# Patient Record
Sex: Female | Born: 1975 | Race: White | Hispanic: No | Marital: Single | Smoking: Current every day smoker
Health system: Southern US, Community
[De-identification: ages and names within clinical notes are randomized; demographics above are authoritative.]

## PROBLEM LIST (undated history)

## (undated) DIAGNOSIS — F429 Obsessive-compulsive disorder, unspecified: Secondary | ICD-10-CM

## (undated) DIAGNOSIS — F4310 Post-traumatic stress disorder, unspecified: Secondary | ICD-10-CM

## (undated) DIAGNOSIS — K219 Gastro-esophageal reflux disease without esophagitis: Secondary | ICD-10-CM

## (undated) DIAGNOSIS — G5600 Carpal tunnel syndrome, unspecified upper limb: Secondary | ICD-10-CM

## (undated) DIAGNOSIS — F319 Bipolar disorder, unspecified: Secondary | ICD-10-CM

## (undated) DIAGNOSIS — M6530 Trigger finger, unspecified finger: Secondary | ICD-10-CM

## (undated) DIAGNOSIS — F419 Anxiety disorder, unspecified: Secondary | ICD-10-CM

## (undated) DIAGNOSIS — C801 Malignant (primary) neoplasm, unspecified: Secondary | ICD-10-CM

## (undated) DIAGNOSIS — IMO0001 Reserved for inherently not codable concepts without codable children: Secondary | ICD-10-CM

## (undated) DIAGNOSIS — J45909 Unspecified asthma, uncomplicated: Secondary | ICD-10-CM

## (undated) DIAGNOSIS — M1990 Unspecified osteoarthritis, unspecified site: Secondary | ICD-10-CM

## (undated) HISTORY — PX: CARDIAC CATHETERIZATION: SHX172

## (undated) HISTORY — PX: OTHER SURGICAL HISTORY: SHX169

## (undated) HISTORY — PX: WISDOM TOOTH EXTRACTION: SHX21

## (undated) HISTORY — PX: MOUTH SURGERY: SHX715

## (undated) HISTORY — PX: TUBAL LIGATION: SHX77

## (undated) SURGERY — Surgical Case
Anesthesia: *Unknown

---

## 1998-01-31 ENCOUNTER — Ambulatory Visit (HOSPITAL_COMMUNITY): Admission: RE | Admit: 1998-01-31 | Discharge: 1998-01-31 | Payer: Self-pay | Admitting: Obstetrics & Gynecology

## 1998-03-17 ENCOUNTER — Inpatient Hospital Stay (HOSPITAL_COMMUNITY): Admission: AD | Admit: 1998-03-17 | Discharge: 1998-03-17 | Payer: Self-pay

## 1998-03-28 ENCOUNTER — Observation Stay (HOSPITAL_COMMUNITY): Admission: AD | Admit: 1998-03-28 | Discharge: 1998-03-29 | Payer: Self-pay | Admitting: *Deleted

## 1998-03-31 ENCOUNTER — Encounter: Admission: RE | Admit: 1998-03-31 | Discharge: 1998-06-29 | Payer: Self-pay | Admitting: Obstetrics

## 1998-05-04 ENCOUNTER — Inpatient Hospital Stay (HOSPITAL_COMMUNITY): Admission: AD | Admit: 1998-05-04 | Discharge: 1998-05-04 | Payer: Self-pay | Admitting: Obstetrics

## 1998-05-17 ENCOUNTER — Inpatient Hospital Stay (HOSPITAL_COMMUNITY): Admission: AD | Admit: 1998-05-17 | Discharge: 1998-05-19 | Payer: Self-pay | Admitting: *Deleted

## 2005-01-07 ENCOUNTER — Emergency Department: Payer: Self-pay | Admitting: Emergency Medicine

## 2011-06-09 IMAGING — CR DG CLAVICLE*L*
1 series · 2 of 2 positions shown · non-contrast
Comparison: none

REASON FOR EXAM: pain   Flex 8
COMMENTS:   LMP: tubal lig

PROCEDURE:     DXR - DXR CLAVICLE LEFT  - [DATE]  [DATE]
RESULT:
No acute abnormality identified.

[Series 1: w clavicle ap left · 0.14mm/px · 2 of 2 slices shown]
[im 1/2]
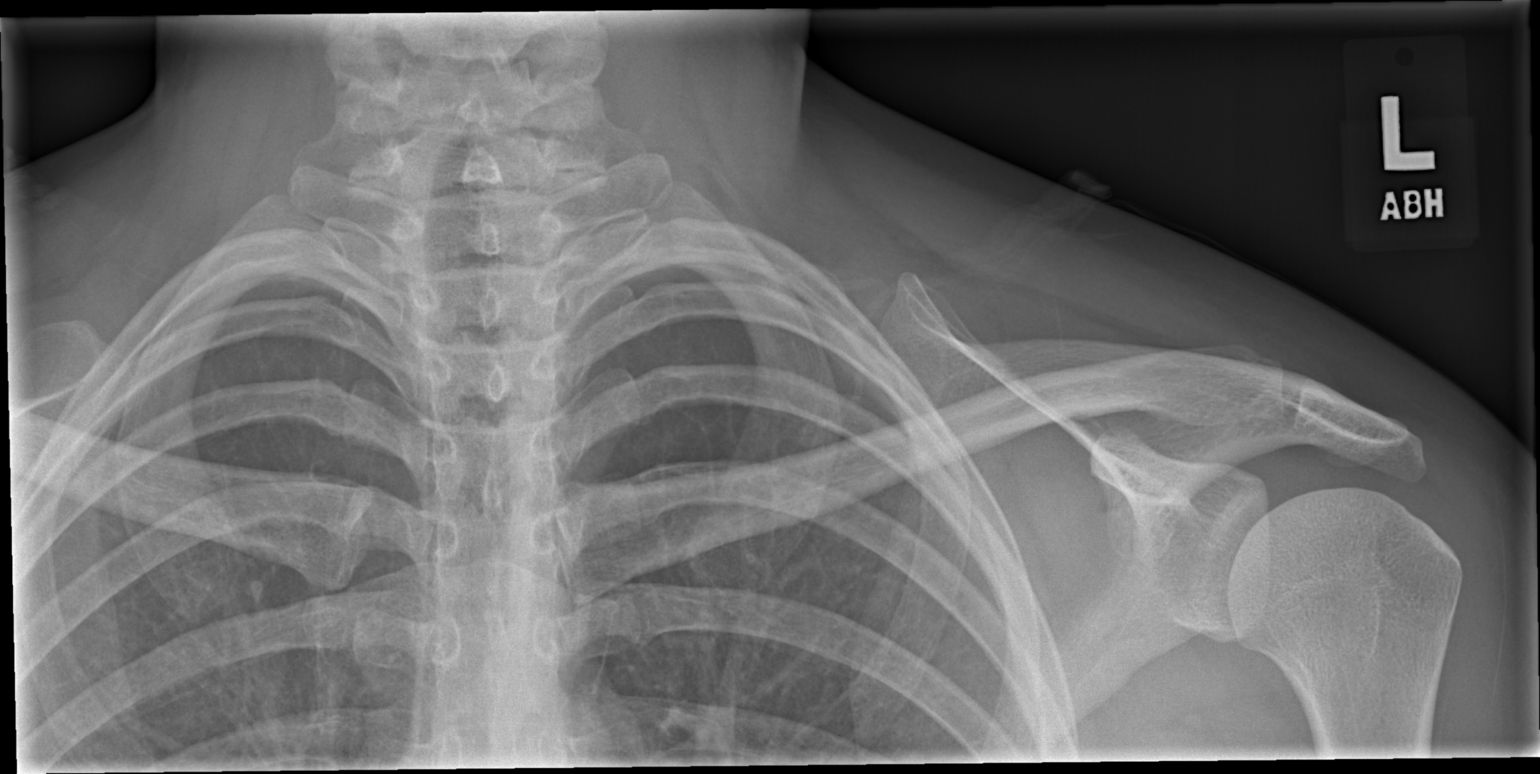
[im 2/2]
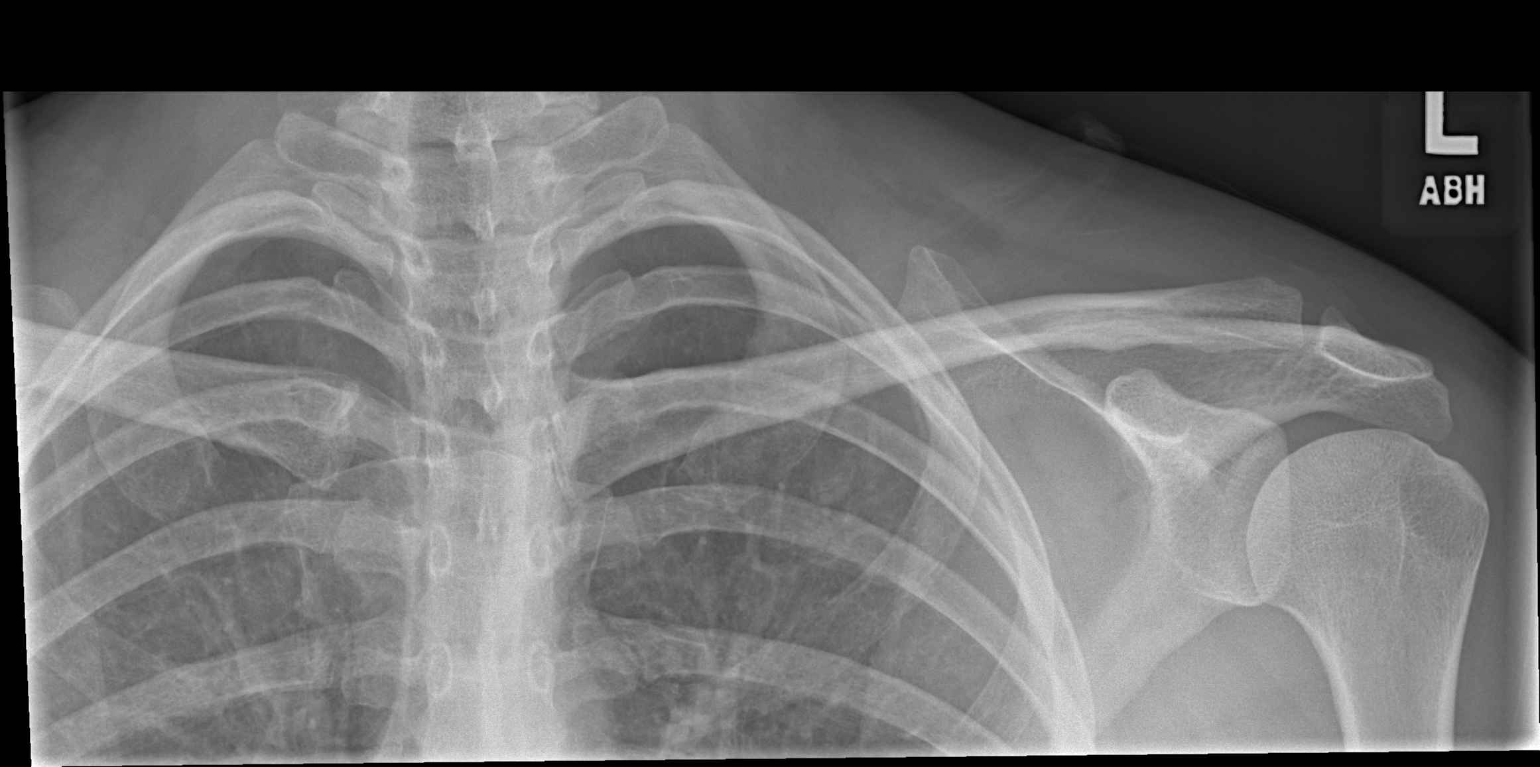

[2 of 2 positions shown; findings below may reference images not displayed]

IMPRESSION: No acute abnormality.

## 2012-01-19 ENCOUNTER — Emergency Department: Payer: Self-pay | Admitting: Emergency Medicine

## 2013-05-01 ENCOUNTER — Emergency Department (HOSPITAL_COMMUNITY)
Admission: EM | Admit: 2013-05-01 | Discharge: 2013-05-02 | Disposition: A | Discharge status: Home / Self Care | Payer: Self-pay | Attending: Emergency Medicine | Admitting: Emergency Medicine

## 2013-05-01 ENCOUNTER — Emergency Department (HOSPITAL_COMMUNITY): Payer: Self-pay

## 2013-05-01 ENCOUNTER — Encounter (HOSPITAL_COMMUNITY): Payer: Self-pay | Admitting: *Deleted

## 2013-05-01 DIAGNOSIS — R079 Chest pain, unspecified: Secondary | ICD-10-CM

## 2013-05-01 DIAGNOSIS — Z79899 Other long term (current) drug therapy: Secondary | ICD-10-CM | POA: Insufficient documentation

## 2013-05-01 DIAGNOSIS — F419 Anxiety disorder, unspecified: Secondary | ICD-10-CM

## 2013-05-01 DIAGNOSIS — R072 Precordial pain: Secondary | ICD-10-CM | POA: Insufficient documentation

## 2013-05-01 DIAGNOSIS — Z87891 Personal history of nicotine dependence: Secondary | ICD-10-CM | POA: Insufficient documentation

## 2013-05-01 DIAGNOSIS — F411 Generalized anxiety disorder: Secondary | ICD-10-CM | POA: Insufficient documentation

## 2013-05-01 LAB — CBC
HCT: 36.9 % (ref 36.0–46.0)
Hemoglobin: 12.8 g/dL (ref 12.0–15.0)
RBC: 4.26 MIL/uL (ref 3.87–5.11)
RDW: 13 % (ref 11.5–15.5)
WBC: 7.6 10*3/uL (ref 4.0–10.5)

## 2013-05-01 LAB — POCT PREGNANCY, URINE: Preg Test, Ur: NEGATIVE

## 2013-05-01 LAB — POCT I-STAT TROPONIN I

## 2013-05-01 LAB — BASIC METABOLIC PANEL
Chloride: 101 mEq/L (ref 96–112)
Creatinine, Ser: 0.68 mg/dL (ref 0.50–1.10)
GFR calc Af Amer: >90 mL/min (ref >90)
Potassium: 3.7 mEq/L (ref 3.5–5.1)
Sodium: 136 mEq/L (ref 135–145)

## 2013-05-01 IMAGING — CR DG CHEST 2V
2 series · 2 of 2 positions shown · non-contrast
Comparison: None.

CLINICAL DATA: Chest pain

CHEST - 2 VIEW

[w chest pa]
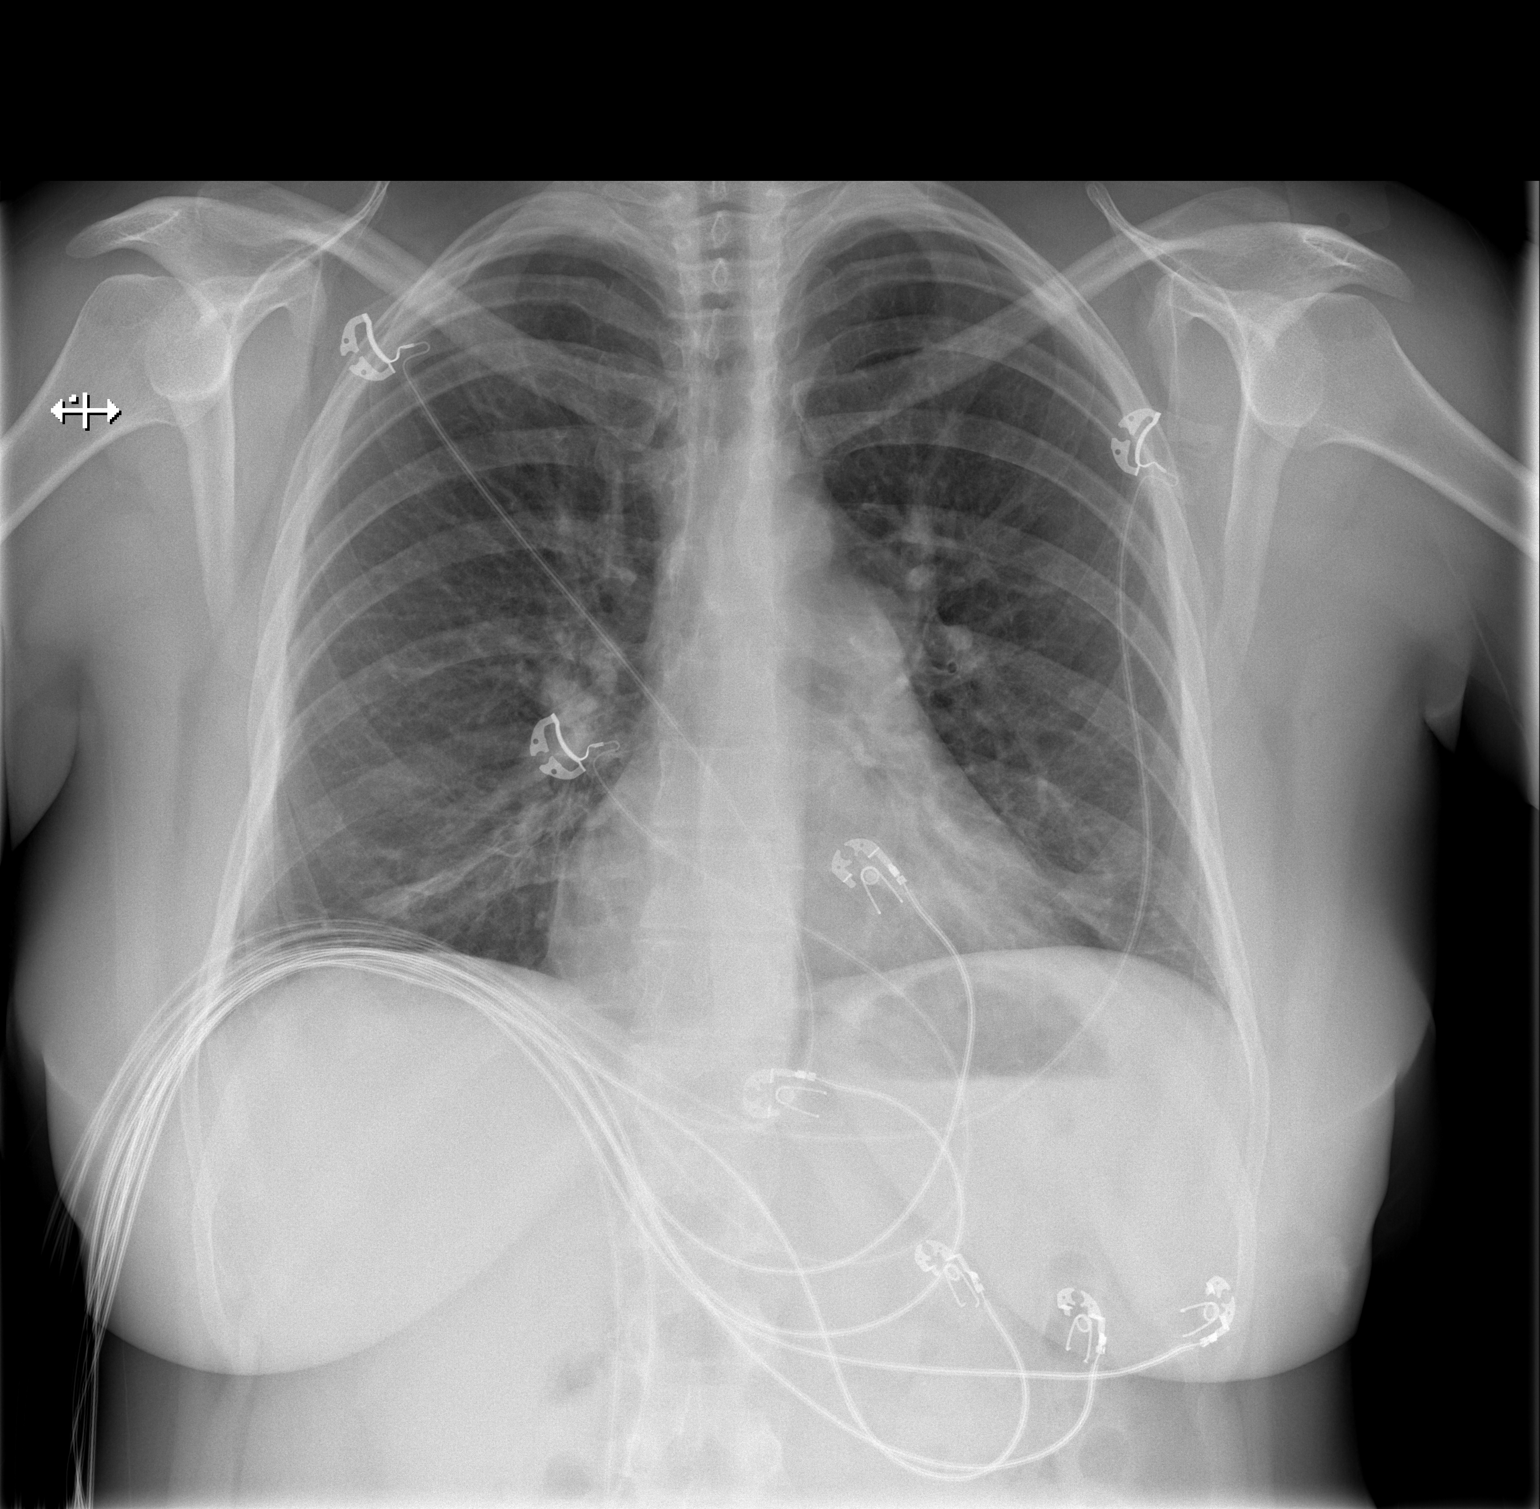

[w chest lat]
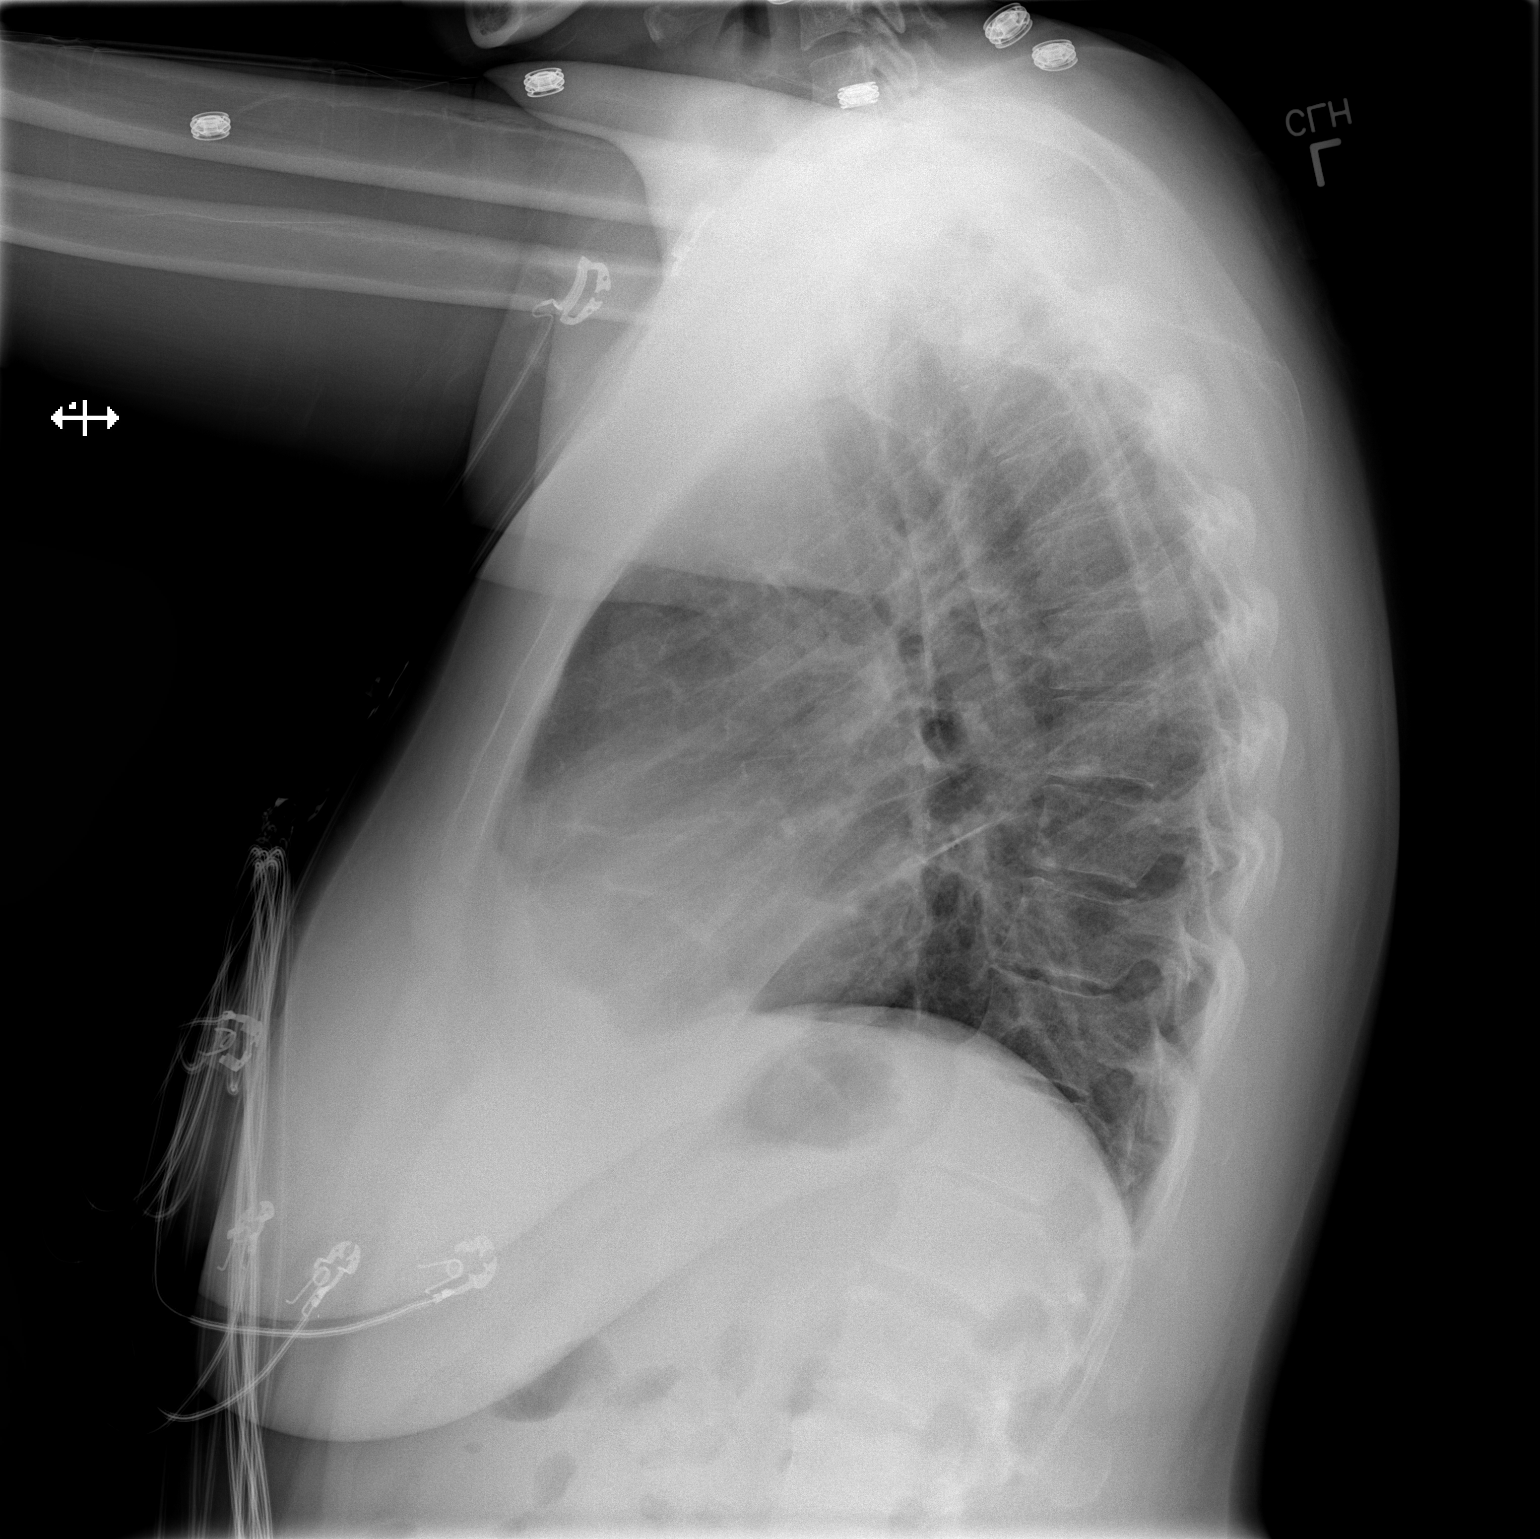

[2 of 2 positions shown; findings below may reference images not displayed]

FINDINGS: Mild lingular opacity.  Lungs are otherwise clear. No
pleural effusion or pneumothorax. The cardiomediastinal contours
are within normal limits. The visualized bones and soft tissues are
without significant appreciable abnormality.
IMPRESSION: Mild lingular opacity; atelectasis versus early infiltrate.

## 2013-05-01 MED ORDER — LORAZEPAM 2 MG/ML IJ SOLN
1.0000 mg | Freq: Once | INTRAMUSCULAR | Status: AC
  Administered 2013-05-01: 1 mg via INTRAVENOUS
  Filled 2013-05-01: qty 1

## 2013-05-01 NOTE — ED Notes (Addendum)
Chest pain - all over; left arm hurting. Onset: 2200; daily basis and intermittent. Much anxiety and stress in life. Nauseated.

## 2013-05-01 NOTE — ED Notes (Signed)
Pt states she was in an AA meeting and she began having central cp that radiates to left arm. Pt rates pain 10/10. Pt states she has been having the pain on and off for about a week, but it has never lasted this long. Pt states " It feels like someone is ripping my heart out". Pt states she has had a cardiac cath before but they were unable to find any blockage.

## 2013-05-02 MED ORDER — HYDROMORPHONE HCL PF 1 MG/ML IJ SOLN
0.5000 mg | Freq: Once | INTRAMUSCULAR | Status: AC
  Administered 2013-05-02: 0.5 mg via INTRAVENOUS
  Filled 2013-05-02: qty 1

## 2013-05-02 MED ORDER — LORAZEPAM 1 MG PO TABS: 1.0000 mg | ORAL_TABLET | Freq: Three times a day (TID) | ORAL | Status: DC | PRN

## 2013-05-02 MED ORDER — ONDANSETRON HCL 4 MG/2ML IJ SOLN
4.0000 mg | Freq: Once | INTRAMUSCULAR | Status: AC
  Administered 2013-05-02: 4 mg via INTRAVENOUS
  Filled 2013-05-02: qty 2

## 2013-05-02 MED ORDER — HYDROMORPHONE HCL PF 1 MG/ML IJ SOLN
1.0000 mg | Freq: Once | INTRAMUSCULAR | Status: AC
  Administered 2013-05-02: 1 mg via INTRAVENOUS
  Filled 2013-05-02: qty 1

## 2013-05-02 MED ORDER — LORAZEPAM 2 MG/ML IJ SOLN
1.0000 mg | Freq: Once | INTRAMUSCULAR | Status: AC
  Administered 2013-05-02: 1 mg via INTRAVENOUS
  Filled 2013-05-02: qty 1

## 2013-05-02 NOTE — ED Provider Notes (Signed)
History     CSN: [id number]  Arrival date & time 05/01/13  2250   First MD Initiated Contact with Patient 05/01/13 2305      Chief Complaint  Patient presents with  . Chest Pain    (Consider location/radiation/quality/duration/timing/severity/associated sxs/prior treatment) Patient is a 37 y.o. female presenting with chest pain. The history is provided by the patient (pt complains of chest pain). No language interpreter was used.  Chest Pain Pain location:  Substernal area Pain quality: aching   Pain radiates to:  Does not radiate Pain radiates to the back: no   Pain severity:  Moderate Timing:  Constant Chronicity:  New Associated symptoms: no abdominal pain, no back pain, no cough, no fatigue and no headache     History reviewed. No pertinent past medical history.  Past Surgical History  Procedure Laterality Date  . Cardiac catheterization      No family history on file.  History  Substance Use Topics  . Smoking status: Former Games developer  . Smokeless tobacco: Not on file  . Alcohol Use: No     Comment: 6 months clean.    OB History   Grav Para Term Preterm Abortions TAB SAB Ect Mult Living                  Review of Systems  Constitutional: Negative for appetite change and fatigue.  HENT: Negative for congestion, sinus pressure and ear discharge.   Eyes: Negative for discharge.  Respiratory: Negative for cough.   Cardiovascular: Positive for chest pain.  Gastrointestinal: Negative for abdominal pain and diarrhea.  Genitourinary: Negative for frequency and hematuria.  Musculoskeletal: Negative for back pain.  Skin: Negative for rash.  Neurological: Negative for seizures and headaches.  Psychiatric/Behavioral: Negative for hallucinations.    Allergies  Latex  Home Medications   Current Outpatient Rx  Name  Route  Sig  Dispense  Refill  . carbamazepine (TEGRETOL) 200 MG tablet   Oral   Take 200-400 mg by mouth 2 (two) times daily. 200 mg in a.m and  400 mg in pm         . FLUoxetine (PROZAC) 40 MG capsule   Oral   Take 40 mg by mouth every morning.          . traZODone (DESYREL) 100 MG tablet   Oral   Take 250 mg by mouth at bedtime.         Marland Kitchen LORazepam (ATIVAN) 1 MG tablet   Oral   Take 1 tablet (1 mg total) by mouth 3 (three) times daily as needed for anxiety.   20 tablet   0     BP 114/70  Pulse 83  Temp(Src) 98.4 F (36.9 C) (Oral)  Resp 17  SpO2 93%  LMP 04/01/2013  Physical Exam  Constitutional: She is oriented to person, place, and time. She appears well-developed.  HENT:  Head: Normocephalic.  Eyes: Conjunctivae and EOM are normal. No scleral icterus.  Neck: Neck supple. No thyromegaly present.  Cardiovascular: Normal rate and regular rhythm.  Exam reveals no gallop and no friction rub.   No murmur heard. Pulmonary/Chest: No stridor. She has no wheezes. She has no rales. She exhibits no tenderness.  Abdominal: She exhibits no distension. There is no tenderness. There is no rebound.  Musculoskeletal: Normal range of motion. She exhibits no edema.  Lymphadenopathy:    She has no cervical adenopathy.  Neurological: She is oriented to person, place, and time. Coordination normal.  Skin:  No rash noted. No erythema.  Psychiatric:  anxiety    ED Course  Procedures (including critical care time)  Labs Reviewed  BASIC METABOLIC PANEL  CBC  POCT I-STAT TROPONIN I  POCT PREGNANCY, URINE   Dg Chest 2 View  05/02/2013  *RADIOLOGY REPORT*  Clinical Data: Chest pain  CHEST - 2 VIEW  Comparison: None.  Findings: Mild lingular opacity.  Lungs are otherwise clear. No pleural effusion or pneumothorax. The cardiomediastinal contours are within normal limits. The visualized bones and soft tissues are without significant appreciable abnormality.  IMPRESSION: Mild lingular opacity; atelectasis versus early infiltrate.   Original Report Authenticated By: Jearld Lesch, M.D.      1. Chest pain   2. Anxiety       Date: 05/02/2013  Rate: 101  Rhythm: normal sinus rhythm  QRS Axis: normal  Intervals: normal  ST/T Wave abnormalities: normal  Conduction Disutrbances:none  Narrative Interpretation:   Old EKG Reviewed: unchanged    MDM          Benny Lennert, MD 05/02/13 [id number]

## 2013-05-02 NOTE — ED Notes (Signed)
Pt returned from CT. Reporting that pain is 8/10. Requesting more pain medication. MD made aware.

## 2013-05-02 NOTE — ED Notes (Signed)
Pt becoming incresingly agitated. Reports that pain rx has not been effective so far. Reports that pain has now moved from left chest to stomach. Reports 1 episode of vomiting. Denies nausea at this time but reports 10/10 cramping stomach pain. MD Zammit made aware.

## 2016-04-16 ENCOUNTER — Other Ambulatory Visit: Payer: Self-pay | Admitting: Physician Assistant

## 2016-04-16 DIAGNOSIS — R112 Nausea with vomiting, unspecified: Secondary | ICD-10-CM

## 2016-04-16 DIAGNOSIS — R1900 Intra-abdominal and pelvic swelling, mass and lump, unspecified site: Secondary | ICD-10-CM

## 2016-04-16 DIAGNOSIS — R1084 Generalized abdominal pain: Secondary | ICD-10-CM

## 2016-04-23 ENCOUNTER — Ambulatory Visit
Admission: RE | Admit: 2016-04-23 | Discharge: 2016-04-23 | Disposition: A | Discharge status: Home / Self Care | Payer: BLUE CROSS/BLUE SHIELD | Source: Ambulatory Visit | Attending: Physician Assistant | Admitting: Physician Assistant

## 2016-04-23 DIAGNOSIS — R1084 Generalized abdominal pain: Secondary | ICD-10-CM

## 2016-04-23 DIAGNOSIS — R1900 Intra-abdominal and pelvic swelling, mass and lump, unspecified site: Secondary | ICD-10-CM

## 2016-04-23 DIAGNOSIS — R112 Nausea with vomiting, unspecified: Secondary | ICD-10-CM

## 2016-04-23 IMAGING — US US ABDOMEN COMPLETE
1 series · 14 of 25 positions shown · non-contrast
Comparison: None.

CLINICAL DATA: 39-year-old female with abdominal pain, nausea,
vomiting and abdominal swelling. Initial encounter.

EXAM:
ABDOMEN ULTRASOUND COMPLETE

[Series 1: us abdomen complete · 0.24mm/px · 14 of 88 slices shown]
[im 1/88]
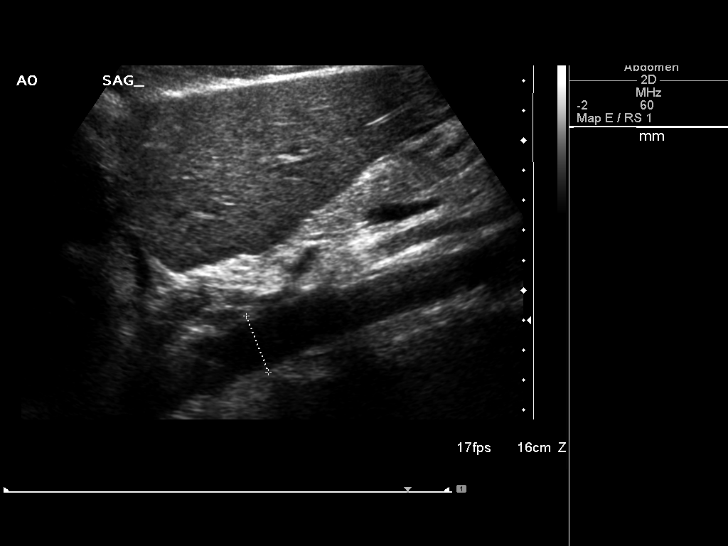
[im 8/88]
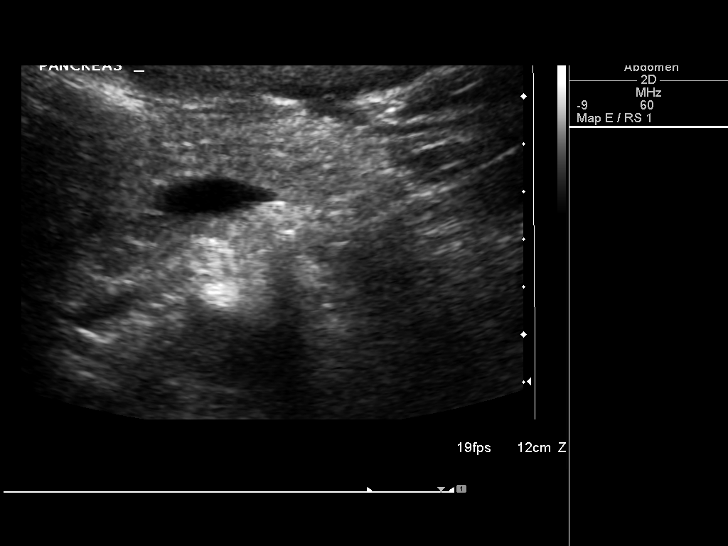
[im 15/88]
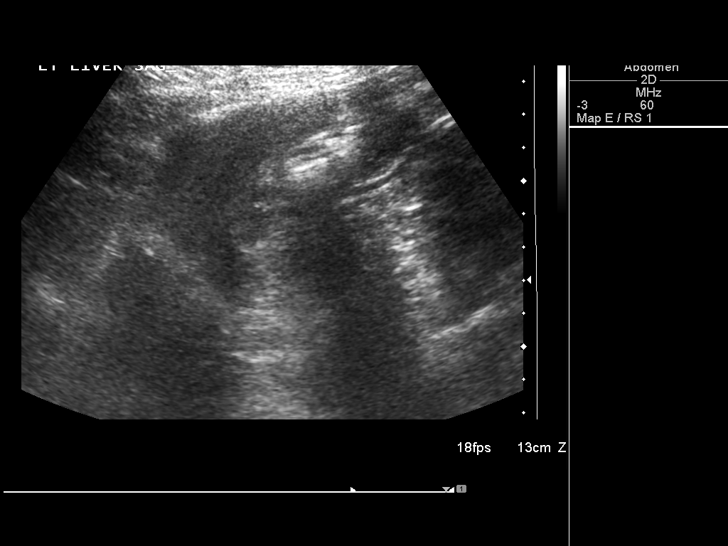
[im 22/88]
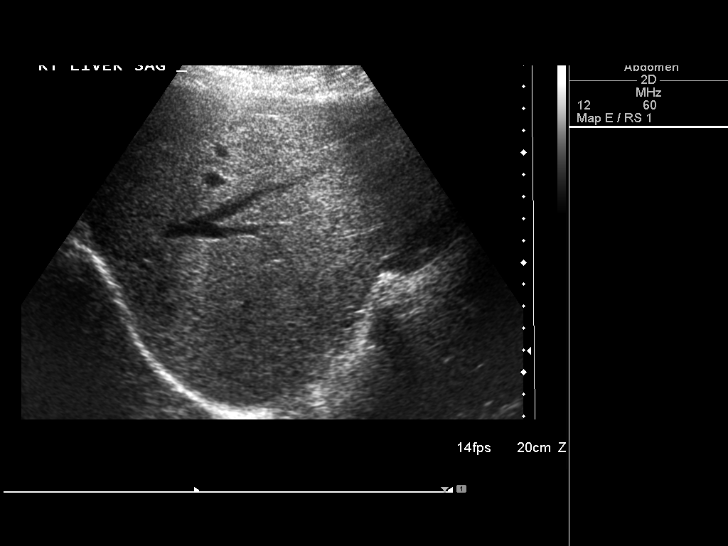
[im 30/88]
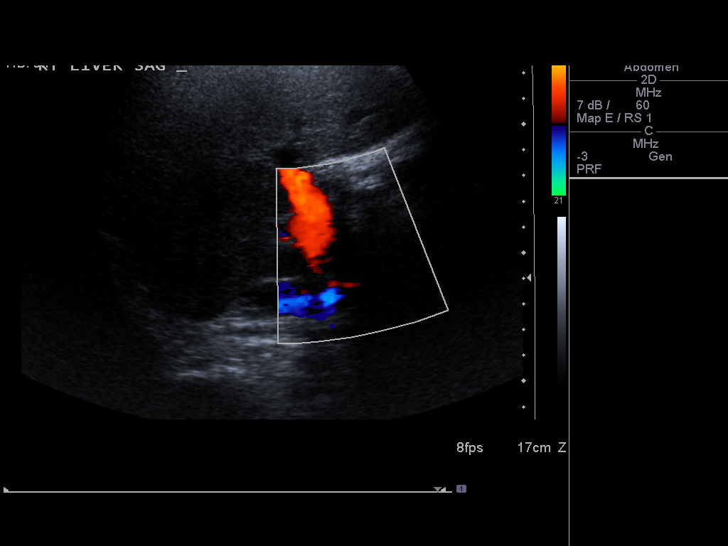
[im 33/88]
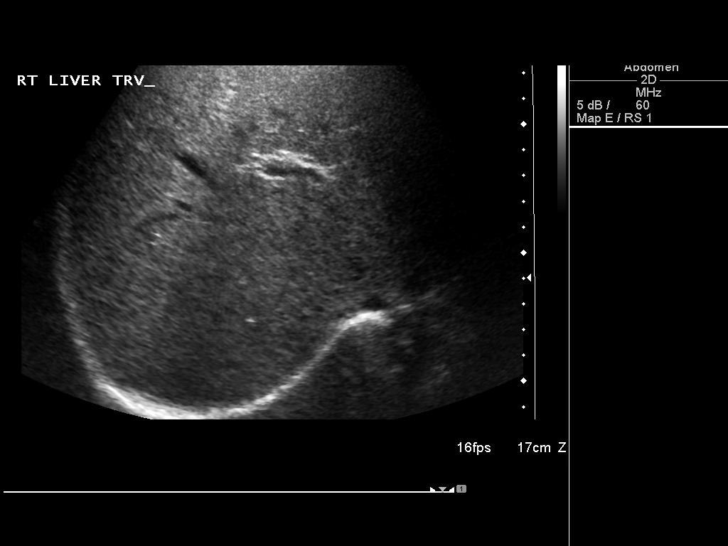
[im 40/88]
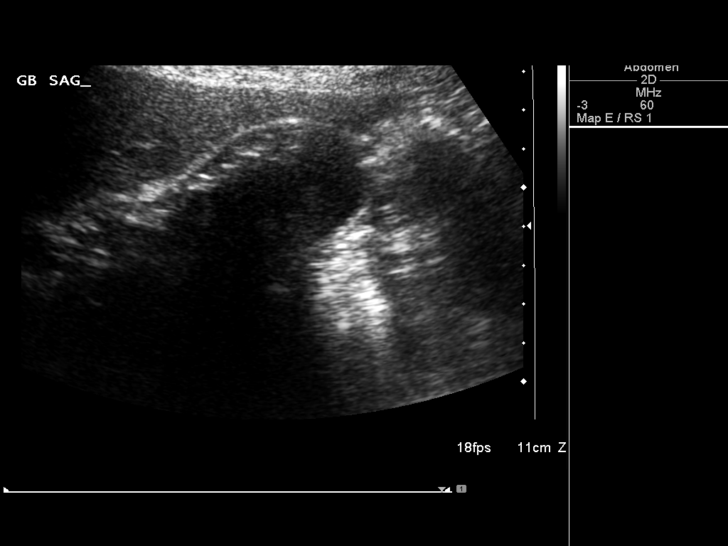
[im 48/88]
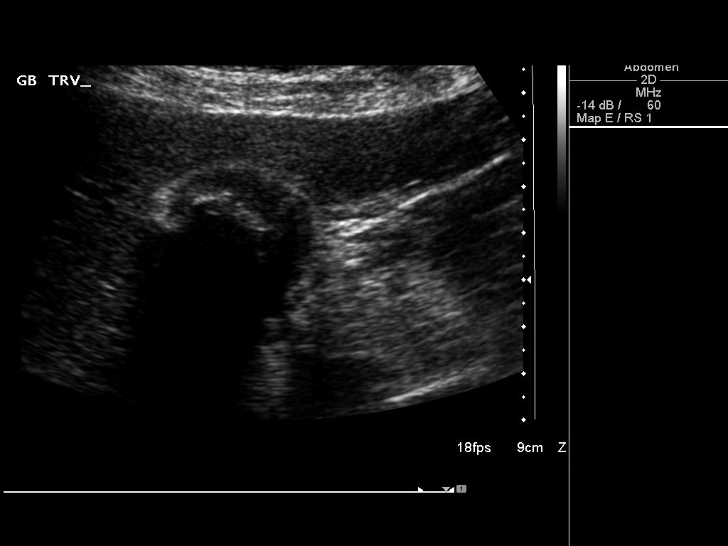
[im 55/88]
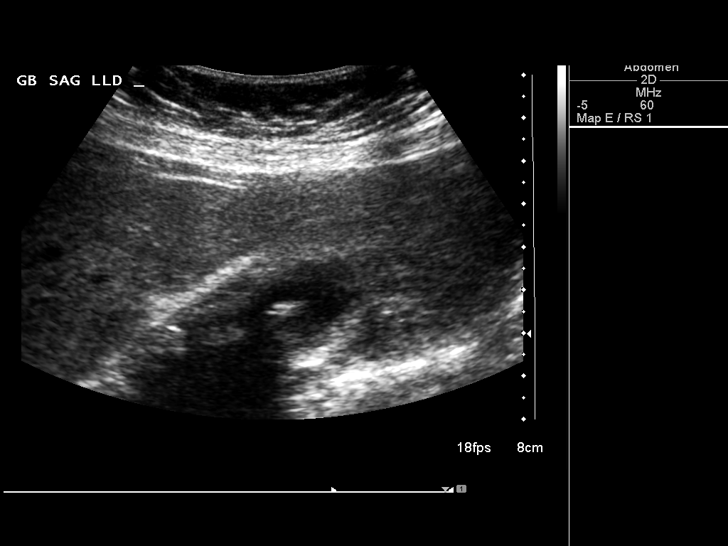
[im 59/88]
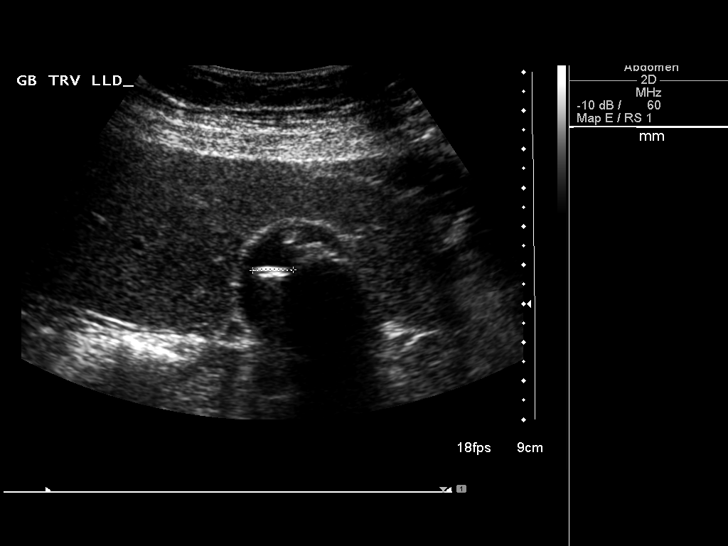
[im 66/88]
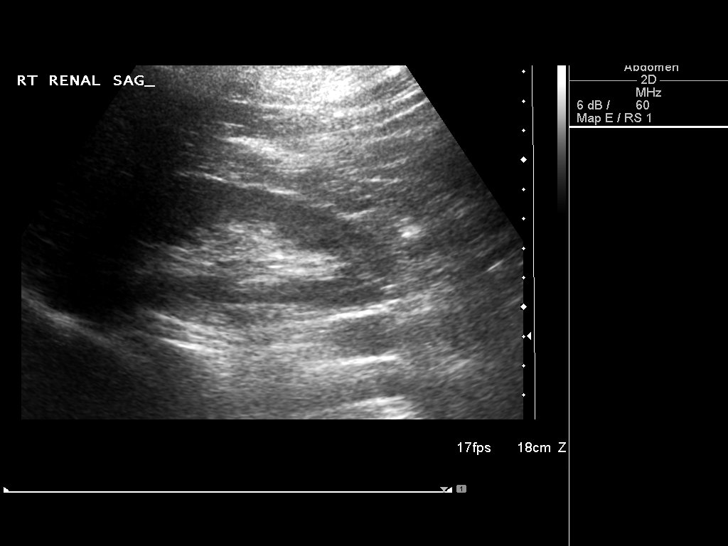
[im 73/88]
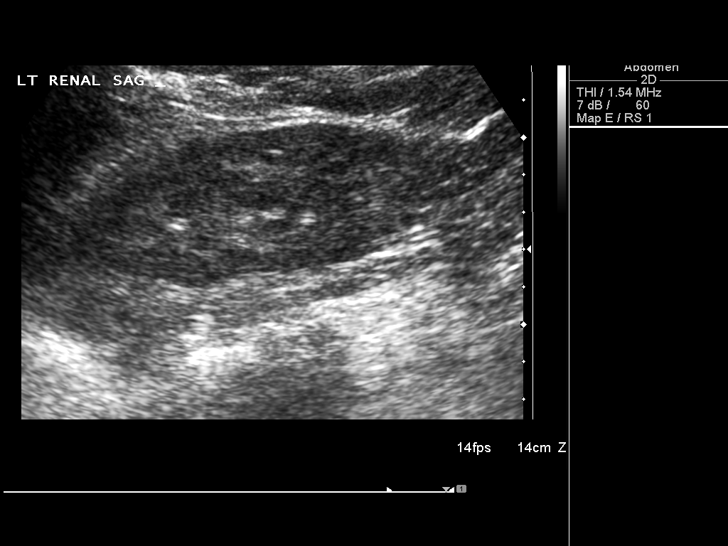
[im 80/88]
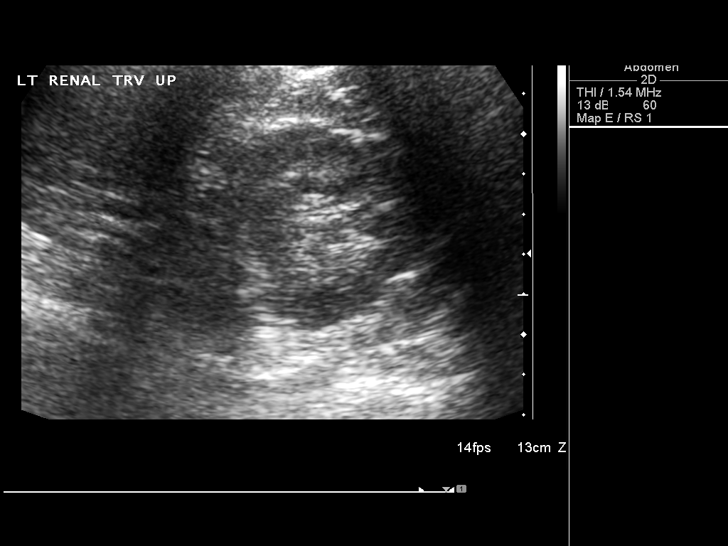
[im 88/88]
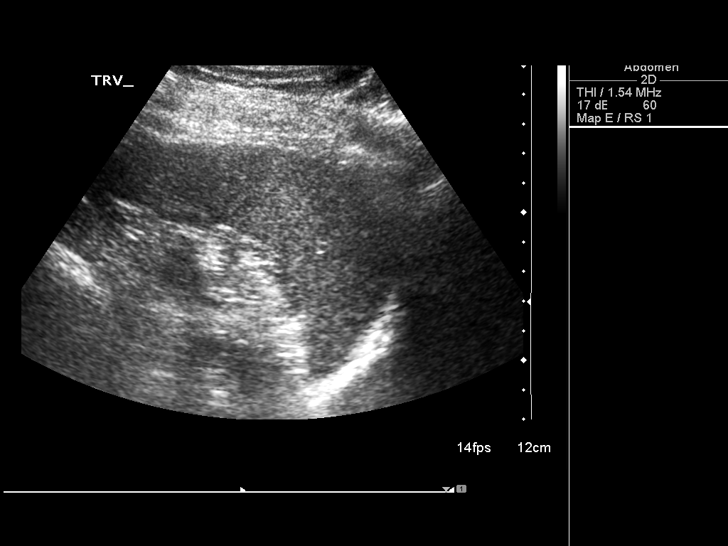

[14 of 25 positions shown; findings below may reference images not displayed]

FINDINGS: Gallbladder: Multiple gallstones measuring up to 2 cm. No
gallbladder wall thickening or pericholecystic fluid.

Common bile duct: Diameter: 5.1 mm.

Liver: No focal lesion identified. Within normal limits in
parenchymal echogenicity.

IVC: No abnormality visualized.

Pancreas: Visualized portion unremarkable.

Spleen: Size and appearance within normal limits.

Right Kidney: Length: 11.5 cm. Echogenicity within normal limits. No
mass or hydronephrosis visualized.

Left Kidney: Length: 11.2 cm. Echogenicity within normal limits. No
mass or hydronephrosis visualized.

Abdominal aorta: No aneurysm visualized.

Other findings: None.
IMPRESSION: Multiple gallstones measuring up to 2 cm. No pericholecystic fluid
or gallbladder wall thickening.

These results will be called to the ordering clinician or
representative by the Radiologist Assistant, and communication
documented in the PACS or zVision Dashboard.

## 2016-05-08 ENCOUNTER — Ambulatory Visit: Payer: Self-pay | Admitting: General Surgery

## 2016-05-08 NOTE — H&P (Signed)
Teresa [name] 05/08/2016 1:31 PM Location: [facility] Patient #: [id number] DOB: Jan 23, 1976 Single / Language: Teresa [name] / Race: White Female  History of Present Illness Teresa Hollingshead MD; 05/08/2016 2:12 PM) The patient is a 40 year old female.   Note:She is referred by Teresa Goody, PA. For about 2 years, she's been having some epigastric discomfort and bloating intermittently followed by nausea and vomiting. This has progressively been getting worse. Typically after she eats dinner at 6:00, she notices later in the evening some significant pressure type epigastric pain that sometimes go through to the back and sometimes is associated with nausea and vomiting. She is started on a proton pump inhibitor but didn't know this any change in her symptoms. Ultrasound demonstrates multiple gallstones with the largest being 2 cm. Common bile duct diameter is normal. Occasionally when she throws up, she notices food that she ate 6 or 8 hours ago. She also has some intermittent diarrhea. Liver function tests have been normal.  Other Problems Teresa [name]; 05/08/2016 1:31 PM) Alcohol Abuse Anxiety Disorder Arthritis Cholelithiasis Diabetes Mellitus Gastroesophageal Reflux Disease Hemorrhoids Umbilical Hernia Repair  Past Surgical History Teresa [name]; 05/08/2016 1:31 PM) Oral Surgery  Diagnostic Studies History Teresa [name], Teresa [name]; 05/08/2016 1:31 PM) Colonoscopy never Mammogram within last year Pap Smear 1-5 years ago  Allergies Teresa [name]; 05/08/2016 1:32 PM) Latex Exam Gloves *MEDICAL DEVICES AND SUPPLIES*  Medication History Teresa [name]; 05/08/2016 1:32 PM) Zolpidem Tartrate (10MG  Tablet, Oral) Active. Pantoprazole Sodium (40MG  Tablet DR, Oral) Active. ProAir HFA (108 (90 Base)MCG/ACT Aerosol Soln, Inhalation) Active. Medications Reconciled  Social History Teresa [name], Teresa [name]; 05/08/2016 1:31 PM) Alcohol use Remotely quit alcohol  use. Caffeine use Coffee. Illicit drug use Remotely quit drug use. Tobacco use Current every day smoker.  Family History Teresa [name], Teresa [name]; 05/08/2016 1:31 PM) Alcohol Abuse Father. Arthritis Mother. Depression Father, Mother. Respiratory Condition Mother.  Pregnancy / Birth History Teresa [name]; 05/08/2016 1:31 PM) Age at menarche 2 years. Contraceptive History Oral contraceptives. Gravida 5 Irregular periods Maternal age <15 Para 4     Review of Systems Teresa [name] [name]; 05/08/2016 1:31 PM) General Present- Appetite Loss, Fatigue, Weight Gain and Weight Loss. Not Present- Chills, Fever and Night Sweats. Skin Not Present- Change in Wart/Mole, Dryness, Hives, Jaundice, New Lesions, Non-Healing Wounds, Rash and Ulcer. HEENT Present- Seasonal Allergies. Not Present- Earache, Hearing Loss, Hoarseness, Nose Bleed, Oral Ulcers, Ringing in the Ears, Sinus Pain, Sore Throat, Visual Disturbances, Wears glasses/contact lenses and Yellow Eyes. Respiratory Not Present- Bloody sputum, Chronic Cough, Difficulty Breathing, Snoring and Wheezing. Breast Not Present- Breast Mass, Breast Pain, Nipple Discharge and Skin Changes. Cardiovascular Present- Rapid Heart Rate and Shortness of Breath. Not Present- Chest Pain, Difficulty Breathing Lying Down, Leg Cramps, Palpitations and Swelling of Extremities. Gastrointestinal Present- Abdominal Pain, Bloating, Change in Bowel Habits, Chronic diarrhea, Constipation, Excessive gas, Hemorrhoids, Indigestion, Nausea, Rectal Pain and Vomiting. Not Present- Bloody Stool, Difficulty Swallowing and Gets full quickly at meals. Female Genitourinary Present- Urgency. Not Present- Frequency, Nocturia, Painful Urination and Pelvic Pain. Musculoskeletal Present- Back Pain, Joint Pain, Joint Stiffness and Muscle Weakness. Not Present- Muscle Pain and Swelling of Extremities. Neurological Present- Trouble walking. Not Present- Decreased Memory, Fainting,  Headaches, Numbness, Seizures, Tingling, Tremor and Weakness. Psychiatric Present- Anxiety, Bipolar and Change in Sleep Pattern. Not Present- Depression, Fearful and Frequent crying. Endocrine Not Present- Cold Intolerance, Excessive Hunger, Hair Changes, Heat Intolerance, Hot flashes and New Diabetes. Hematology Not Present- Easy Bruising, Excessive  bleeding, Gland problems, HIV and Persistent Infections.  Vitals Teresa [name] [name]; 05/08/2016 1:33 PM) 05/08/2016 1:32 PM Weight: 186 lb Height: 65in Body Surface Area: 1.92 m Body Mass Index: 30.95 kg/m  Temp.: 98.35F  Pulse: 82 (Regular)  BP: 130/78 (Sitting, Left Arm, Standard)      Physical Exam Teresa Hollingshead MD; 05/08/2016 2:15 PM)  The physical exam findings are as follows: Note:General: Overweight female in NAD. Pleasant and cooperative.  HEENT: [place]/AT, no facial masses  EYES: EOMI, no icterus  CV: RRR, no murmur, no JVD.  CHEST: Breath sounds equal and clear. Respirations nonlabored.  ABDOMEN: Soft, nontender, nondistended, no masses, no organomegaly, active bowel sounds, subumbilical scar with small adjacent bulge  MUSCULOSKELETAL: FROM, good muscle tone, no edema  SKIN: No jaundice or suspicious rashes.  NEUROLOGIC: Alert and oriented, answers questions appropriately, normal gait and station.  PSYCHIATRIC: Normal mood, affect , and behavior.    Assessment & Plan Teresa Hollingshead MD; 05/08/2016 2:16 PM)  SYMPTOMATIC CHOLELITHIASIS (K80.20) Impression: Most her of her symptoms fit with this. The one thing that does not quite fit is vomiting of undigested food 6-8 hours later which is occasional. Also has a small umbilical hernia.  Plan: Laparoscopic cholecystectomy with cholangiogram, possible umbilical hernia repair with mesh. I have explained the procedure, risks, and aftercare of cholecystectomy. Risks include but are not limited to bleeding, infection, wound problems, anesthesia, diarrhea, bile  leak, injury to common bile duct/liver/intestine. She seems to understand and agrees to proceed. If she still has some vomiting of food significantly after eating, postop, she may need a gastric emptying study  Teresa Confer, MD

## 2016-05-17 NOTE — Patient Instructions (Signed)
Eleftheria Weavil  05/17/2016   Your procedure is scheduled on: 05/29/2016   Report to [facility] Main  Entrance take [facility]  elevators to 3rd floor to  [place] at    [place] AM.  Call this number if you have problems the morning of surgery [phone]   Remember: ONLY 1 PERSON MAY GO WITH YOU TO SHORT STAY TO GET  READY MORNING OF [place].  Do not eat food or drink liquids :After Midnight.     Take these medicines the morning of surgery with A SIP OF WATER: Protonix, Proair Inhaler if needed and bring                                You may not have any metal on your body including hair pins and              piercings  Do not wear jewelry, make-up, lotions, powders or perfumes, deodorant             Do not wear nail polish.  Do not shave  48 hours prior to surgery.               Do not bring valuables to the hospital. [place].  Contacts, dentures or bridgework may not be worn into surgery.      Patients discharged the day of surgery will not be allowed to drive home.  Name and phone number of your driver:  Special Instructions: coughing and deep breathing exercises, leg exercises              Please read over the following fact sheets you were given: _____________________________________________________________________             [facility] - Preparing for Surgery Before surgery, you can play an important role.  Because skin is not sterile, your skin needs to be as free of germs as possible.  You can reduce the number of germs on your skin by washing with CHG (chlorahexidine gluconate) soap before surgery.  CHG is an antiseptic cleaner which kills germs and bonds with the skin to continue killing germs even after washing. Please DO NOT use if you have an allergy to CHG or antibacterial soaps.  If your skin becomes reddened/irritated stop using the CHG and inform your nurse when you arrive at  Short Stay. Do not shave (including legs and underarms) for at least 48 hours prior to the first CHG shower.  You may shave your face/neck. Please follow these instructions carefully:  1.  Shower with CHG Soap the night before surgery and the  morning of Surgery.  2.  If you choose to wash your hair, wash your hair first as usual with your  normal  shampoo.  3.  After you shampoo, rinse your hair and body thoroughly to remove the  shampoo.                           4.  Use CHG as you would any other liquid soap.  You can apply chg directly  to the skin and wash  Gently with a scrungie or clean washcloth.  5.  Apply the CHG Soap to your body ONLY FROM THE NECK DOWN.   Do not use on face/ open                           Wound or open sores. Avoid contact with eyes, ears mouth and genitals (private parts).                       Wash face,  Genitals (private parts) with your normal soap.             6.  Wash thoroughly, paying special attention to the area where your surgery  will be performed.  7.  Thoroughly rinse your body with warm water from the neck down.  8.  DO NOT shower/wash with your normal soap after using and rinsing off  the CHG Soap.                9.  Pat yourself dry with a clean towel.            10.  Wear clean pajamas.            11.  Place clean sheets on your bed the night of your first shower and do not  sleep with pets. Day of Surgery : Do not apply any lotions/deodorants the morning of surgery.  Please wear clean clothes to the hospital/surgery center.  FAILURE TO FOLLOW THESE INSTRUCTIONS MAY RESULT IN THE CANCELLATION OF YOUR SURGERY PATIENT SIGNATURE_________________________________  NURSE SIGNATURE__________________________________  ________________________________________________________________________

## 2016-05-21 ENCOUNTER — Encounter (HOSPITAL_COMMUNITY)
Admission: RE | Admit: 2016-05-21 | Discharge: 2016-05-21 | Disposition: A | Discharge status: Home / Self Care | Payer: BLUE CROSS/BLUE SHIELD | Source: Ambulatory Visit | Attending: General Surgery | Admitting: General Surgery

## 2016-05-21 ENCOUNTER — Encounter (HOSPITAL_COMMUNITY): Payer: Self-pay

## 2016-05-21 DIAGNOSIS — K8020 Calculus of gallbladder without cholecystitis without obstruction: Secondary | ICD-10-CM | POA: Diagnosis not present

## 2016-05-21 DIAGNOSIS — Z01812 Encounter for preprocedural laboratory examination: Secondary | ICD-10-CM | POA: Insufficient documentation

## 2016-05-21 HISTORY — DX: Unspecified osteoarthritis, unspecified site: M19.90

## 2016-05-21 HISTORY — DX: Malignant (primary) neoplasm, unspecified: C80.1

## 2016-05-21 HISTORY — DX: Anxiety disorder, unspecified: F41.9

## 2016-05-21 HISTORY — DX: Gastro-esophageal reflux disease without esophagitis: K21.9

## 2016-05-21 HISTORY — DX: Reserved for inherently not codable concepts without codable children: IMO0001

## 2016-05-21 HISTORY — DX: Trigger finger, unspecified finger: M65.30

## 2016-05-21 HISTORY — DX: Carpal tunnel syndrome, unspecified upper limb: G56.00

## 2016-05-21 LAB — PREGNANCY, URINE: Preg Test, Ur: NEGATIVE

## 2016-05-21 NOTE — Progress Notes (Signed)
CBC/DIff/CMP results done 05/07/2016 on chart

## 2016-05-28 NOTE — Anesthesia Preprocedure Evaluation (Addendum)
Anesthesia Evaluation  Patient identified by MRN, date of birth, ID band Patient awake    Reviewed: Allergy & Precautions, H&P , NPO status , Patient's Chart, lab work & pertinent test results  History of Anesthesia Complications Negative for: history of anesthetic complications  Airway Mallampati: III  TM Distance: >3 FB Neck ROM: full   Comment: Prominent incisors Dental no notable dental hx. (+) Teeth Intact, Dental Advisory Given   Pulmonary asthma , Current Smoker,    Pulmonary exam normal breath sounds clear to auscultation       Cardiovascular negative cardio ROS Normal cardiovascular exam Rhythm:regular Rate:Normal     Neuro/Psych Anxiety History of substance abuse with alcohol but not in several years, she does have anxiety and history of panic attacksnegative neurological ROS     GI/Hepatic Neg liver ROS, GERD  ,  Endo/Other  negative endocrine ROS  Renal/GU negative Renal ROS     Musculoskeletal  (+) Arthritis ,   Abdominal   Peds  Hematology negative hematology ROS (+)   Anesthesia Other Findings   Reproductive/Obstetrics                           Anesthesia Physical Anesthesia Plan  ASA: II  Anesthesia Plan: General   Post-op Pain Management:    Induction: Intravenous  Airway Management Planned: Oral ETT  Additional Equipment:   Intra-op Plan:   Post-operative Plan:   Informed Consent: I have reviewed the patients History and Physical, chart, labs and discussed the procedure including the risks, benefits and alternatives for the proposed anesthesia with the patient or authorized representative who has indicated his/her understanding and acceptance.   Dental Advisory Given  Plan Discussed with: Anesthesiologist, CRNA and Surgeon  Anesthesia Plan Comments:         Anesthesia Quick Evaluation

## 2016-05-29 ENCOUNTER — Ambulatory Visit (HOSPITAL_COMMUNITY): Payer: BLUE CROSS/BLUE SHIELD | Admitting: Anesthesiology

## 2016-05-29 ENCOUNTER — Ambulatory Visit (HOSPITAL_COMMUNITY)
Admission: RE | Admit: 2016-05-29 | Discharge: 2016-05-29 | Disposition: A | Discharge status: Home / Self Care | Payer: BLUE CROSS/BLUE SHIELD | Source: Ambulatory Visit | Attending: General Surgery | Admitting: General Surgery

## 2016-05-29 ENCOUNTER — Encounter (HOSPITAL_COMMUNITY): Payer: Self-pay | Admitting: *Deleted

## 2016-05-29 ENCOUNTER — Ambulatory Visit (HOSPITAL_COMMUNITY): Payer: BLUE CROSS/BLUE SHIELD

## 2016-05-29 ENCOUNTER — Encounter (HOSPITAL_COMMUNITY)
Admission: RE | Disposition: A | Discharge status: Home / Self Care | Payer: Self-pay | Source: Ambulatory Visit | Attending: General Surgery

## 2016-05-29 DIAGNOSIS — F17200 Nicotine dependence, unspecified, uncomplicated: Secondary | ICD-10-CM | POA: Diagnosis not present

## 2016-05-29 DIAGNOSIS — R1013 Epigastric pain: Secondary | ICD-10-CM | POA: Diagnosis present

## 2016-05-29 DIAGNOSIS — K8010 Calculus of gallbladder with chronic cholecystitis without obstruction: Secondary | ICD-10-CM | POA: Diagnosis not present

## 2016-05-29 DIAGNOSIS — Z79899 Other long term (current) drug therapy: Secondary | ICD-10-CM | POA: Diagnosis not present

## 2016-05-29 DIAGNOSIS — E119 Type 2 diabetes mellitus without complications: Secondary | ICD-10-CM | POA: Diagnosis not present

## 2016-05-29 DIAGNOSIS — M1990 Unspecified osteoarthritis, unspecified site: Secondary | ICD-10-CM | POA: Insufficient documentation

## 2016-05-29 DIAGNOSIS — Z419 Encounter for procedure for purposes other than remedying health state, unspecified: Secondary | ICD-10-CM

## 2016-05-29 HISTORY — PX: CHOLECYSTECTOMY: SHX55

## 2016-05-29 LAB — PREGNANCY, URINE: Preg Test, Ur: NEGATIVE

## 2016-05-29 IMAGING — RF DG CHOLANGIOGRAM OPERATIVE
1 series · 4 of 4 positions shown · non-contrast
Comparison: Ultrasound [DATE]

CLINICAL DATA: Cholelithiasis

EXAM:
INTRAOPERATIVE CHOLANGIOGRAM
TECHNIQUE: Cholangiographic images from the C-arm fluoroscopic device were
submitted for interpretation post-operatively. Please see the
procedural report for the amount of contrast and the fluoroscopy
time utilized.

[Series 1: run · 4 of 55 frames shown]
[frame 9/55]
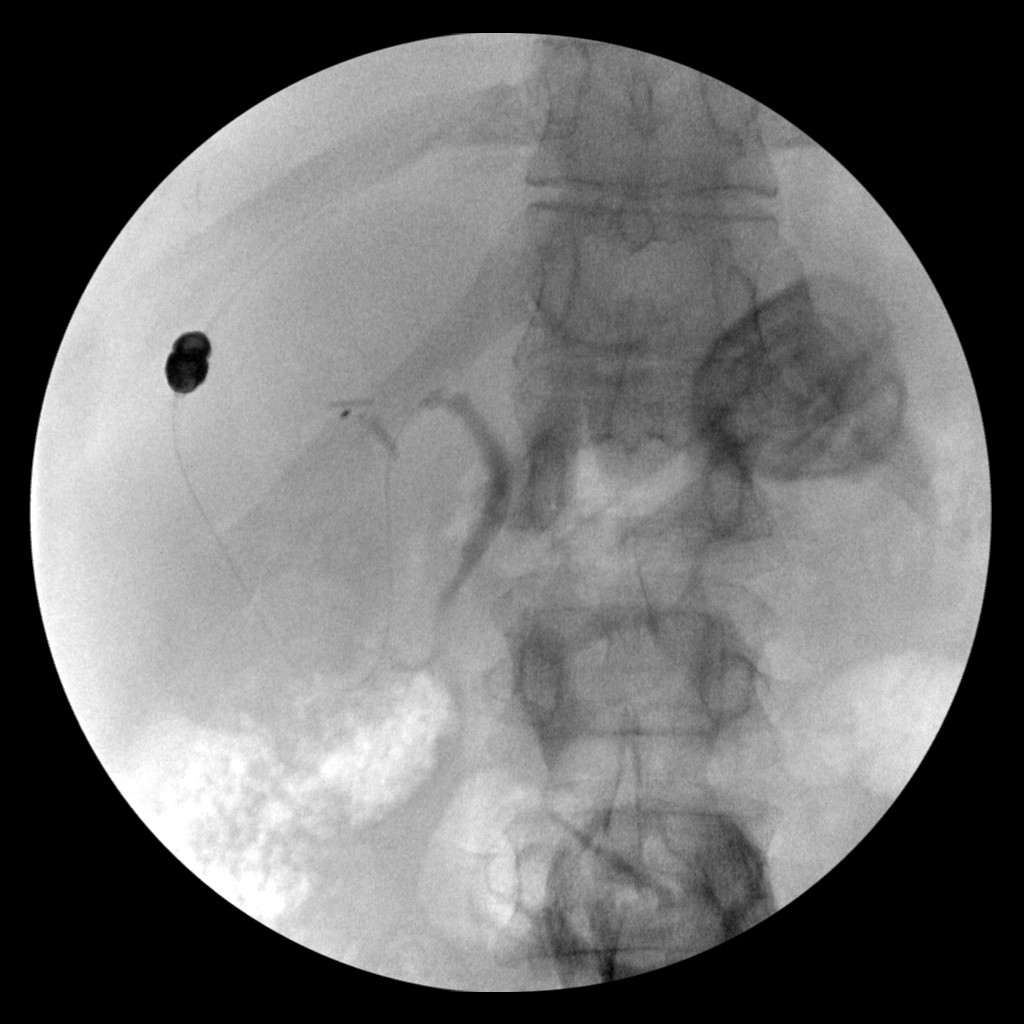
[frame 28/55]
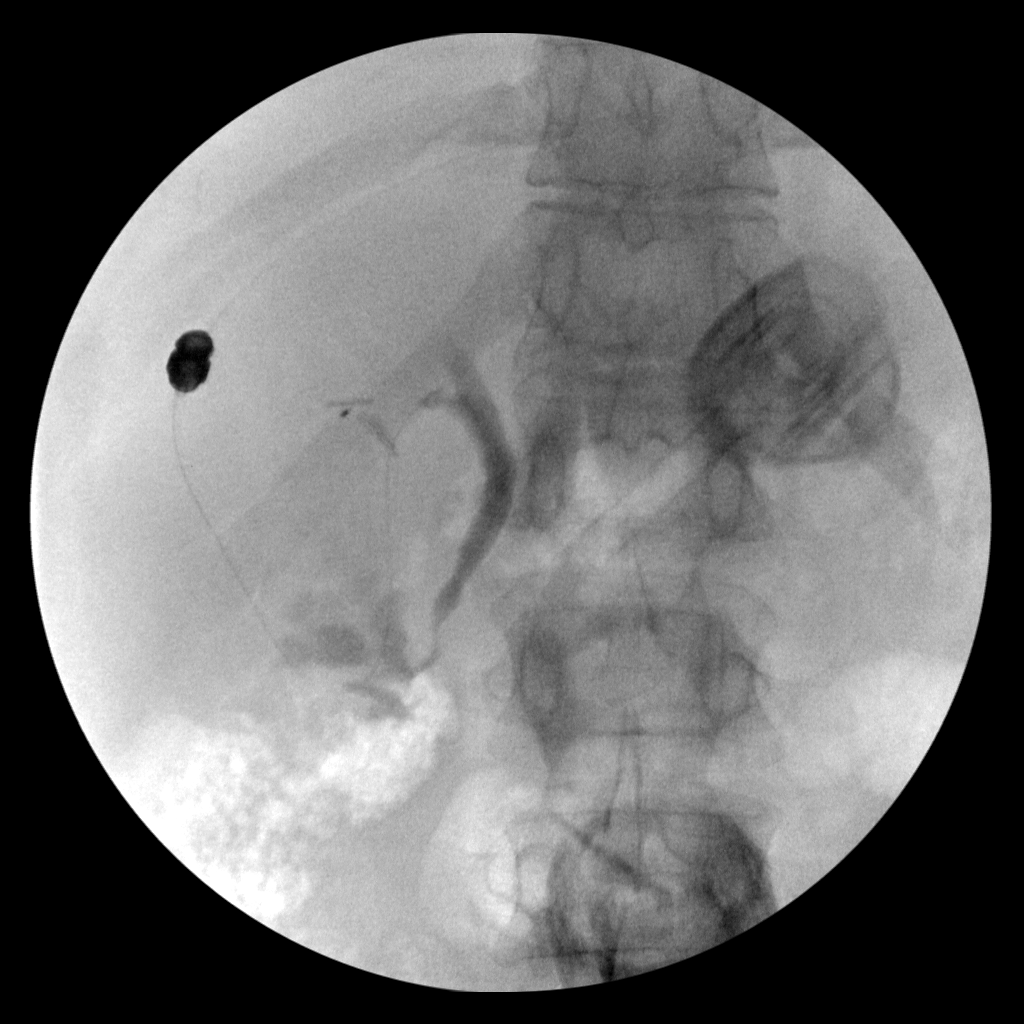
[frame 47/55]
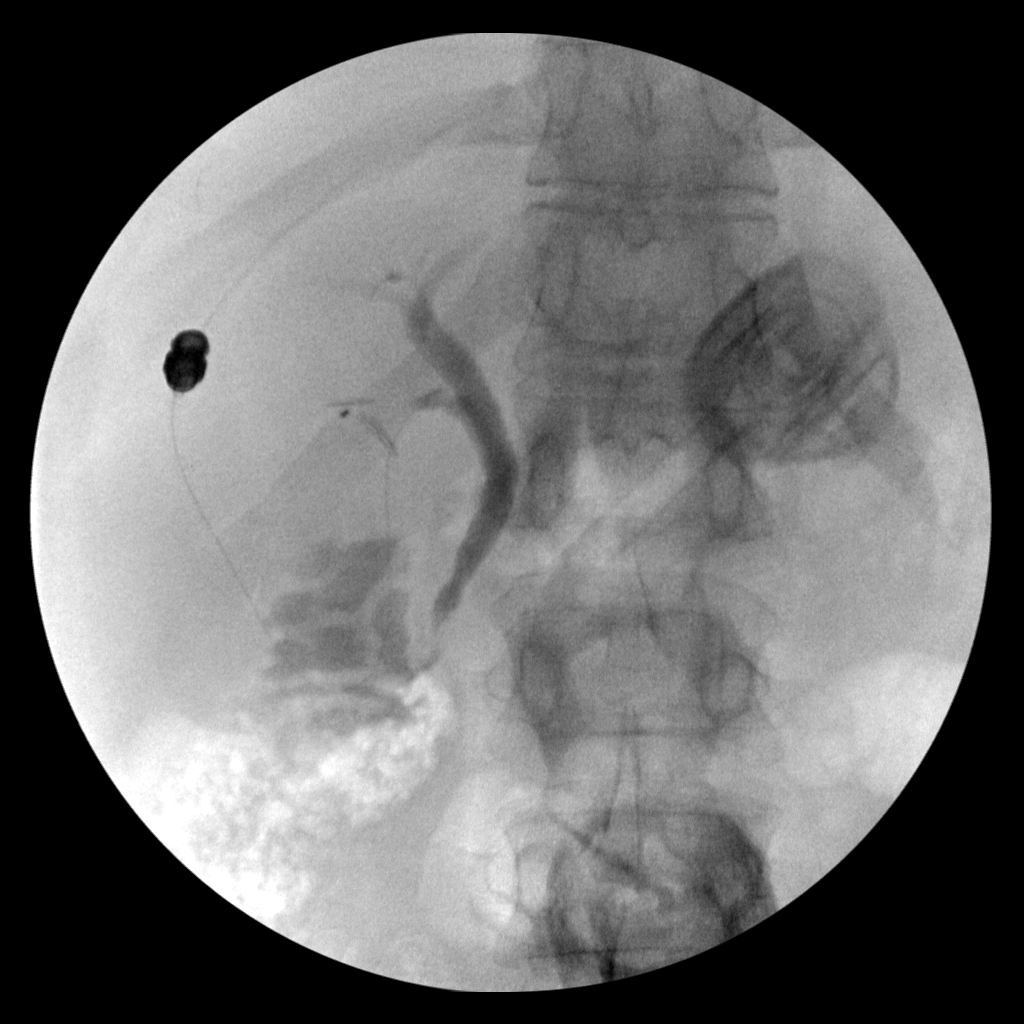
[frame 52/55]
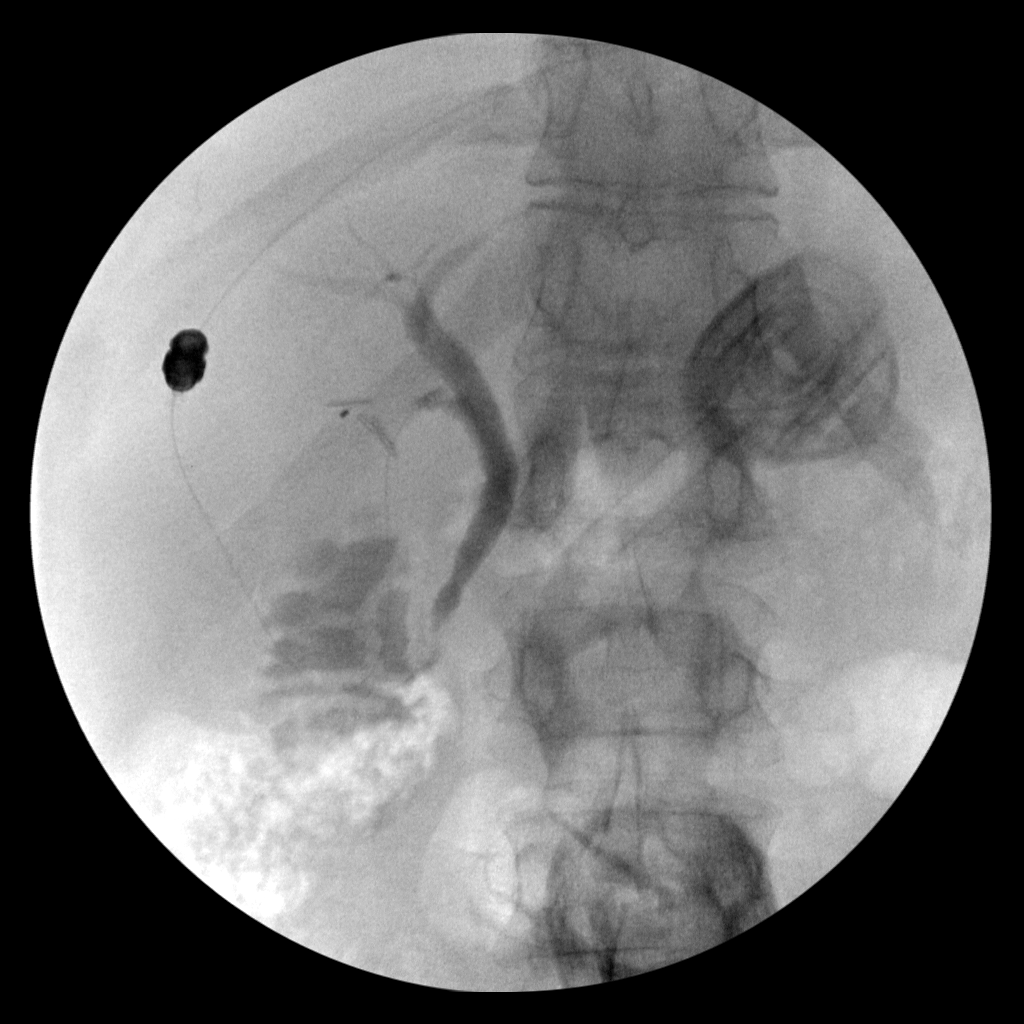

[4 of 4 positions shown; findings below may reference images not displayed]

FINDINGS: No persistent filling defects in the common duct. Intrahepatic ducts
are incompletely visualized, appearing decompressed centrally.
Contrast passes into the duodenum.

:
Negative for retained common duct stone.

## 2016-05-29 SURGERY — LAPAROSCOPIC CHOLECYSTECTOMY WITH INTRAOPERATIVE CHOLANGIOGRAM
Anesthesia: General | Site: Abdomen

## 2016-05-29 MED ORDER — SODIUM CHLORIDE 0.9% FLUSH: 3.0000 mL | INTRAVENOUS | Status: DC | PRN

## 2016-05-29 MED ORDER — METOCLOPRAMIDE HCL 5 MG/ML IJ SOLN
10.0000 mg | Freq: Once | INTRAMUSCULAR | Status: AC | PRN
  Administered 2016-05-29: 10 mg via INTRAVENOUS

## 2016-05-29 MED ORDER — HYDROMORPHONE HCL 1 MG/ML IJ SOLN
INTRAMUSCULAR | Status: AC
  Filled 2016-05-29: qty 1

## 2016-05-29 MED ORDER — IOPAMIDOL (ISOVUE-300) INJECTION 61%
INTRAVENOUS | Status: AC
  Filled 2016-05-29: qty 50

## 2016-05-29 MED ORDER — 0.9 % SODIUM CHLORIDE (POUR BTL) OPTIME
TOPICAL | Status: DC | PRN
  Administered 2016-05-29: 1000 mL

## 2016-05-29 MED ORDER — LIDOCAINE 2% (20 MG/ML) 5 ML SYRINGE
INTRAMUSCULAR | Status: DC | PRN
  Administered 2016-05-29: 60 mg via INTRAVENOUS

## 2016-05-29 MED ORDER — PROPOFOL 10 MG/ML IV BOLUS
INTRAVENOUS | Status: AC
  Filled 2016-05-29: qty 20

## 2016-05-29 MED ORDER — MORPHINE SULFATE (PF) 10 MG/ML IV SOLN: 2.0000 mg | INTRAVENOUS | Status: DC | PRN

## 2016-05-29 MED ORDER — SUGAMMADEX SODIUM 200 MG/2ML IV SOLN
INTRAVENOUS | Status: DC | PRN
  Administered 2016-05-29: 200 mg via INTRAVENOUS

## 2016-05-29 MED ORDER — MIDAZOLAM HCL 5 MG/5ML IJ SOLN
INTRAMUSCULAR | Status: DC | PRN
Start: 2016-05-29 — End: 2016-05-29
  Administered 2016-05-29: 2 mg via INTRAVENOUS

## 2016-05-29 MED ORDER — ONDANSETRON HCL 4 MG/2ML IJ SOLN
INTRAMUSCULAR | Status: AC
  Filled 2016-05-29: qty 2

## 2016-05-29 MED ORDER — PROCHLORPERAZINE EDISYLATE 5 MG/ML IJ SOLN
5.0000 mg | INTRAMUSCULAR | Status: AC
  Administered 2016-05-29: 5 mg via INTRAVENOUS
  Filled 2016-05-29: qty 2

## 2016-05-29 MED ORDER — METOCLOPRAMIDE HCL 5 MG/ML IJ SOLN
INTRAMUSCULAR | Status: AC
  Filled 2016-05-29: qty 2

## 2016-05-29 MED ORDER — LACTATED RINGERS IV SOLN
INTRAVENOUS | Status: DC | PRN
  Administered 2016-05-29 (×2): via INTRAVENOUS

## 2016-05-29 MED ORDER — DEXTROSE IN LACTATED RINGERS 5 % IV SOLN: INTRAVENOUS | Status: DC

## 2016-05-29 MED ORDER — ROCURONIUM BROMIDE 100 MG/10ML IV SOLN
INTRAVENOUS | Status: AC
  Filled 2016-05-29: qty 1

## 2016-05-29 MED ORDER — ONDANSETRON HCL 4 MG PO TABS: 4.0000 mg | ORAL_TABLET | ORAL | Status: DC | PRN

## 2016-05-29 MED ORDER — SODIUM CHLORIDE 0.9 % IV SOLN
INTRAVENOUS | Status: DC | PRN
  Administered 2016-05-29: 5 mL

## 2016-05-29 MED ORDER — OXYCODONE HCL 5 MG PO TABS
5.0000 mg | ORAL_TABLET | ORAL | Status: DC | PRN
  Administered 2016-05-29: 5 mg via ORAL
  Filled 2016-05-29: qty 1

## 2016-05-29 MED ORDER — DEXAMETHASONE SODIUM PHOSPHATE 10 MG/ML IJ SOLN
INTRAMUSCULAR | Status: AC
  Filled 2016-05-29: qty 1

## 2016-05-29 MED ORDER — KETOROLAC TROMETHAMINE 30 MG/ML IJ SOLN
INTRAMUSCULAR | Status: DC | PRN
  Administered 2016-05-29: 30 mg via INTRAVENOUS

## 2016-05-29 MED ORDER — ROCURONIUM BROMIDE 100 MG/10ML IV SOLN
INTRAVENOUS | Status: DC | PRN
Start: 2016-05-29 — End: 2016-05-29
  Administered 2016-05-29: 20 mg via INTRAVENOUS
  Administered 2016-05-29: 40 mg via INTRAVENOUS

## 2016-05-29 MED ORDER — SODIUM CHLORIDE 0.9 % IV SOLN: 250.0000 mL | INTRAVENOUS | Status: DC | PRN

## 2016-05-29 MED ORDER — ONDANSETRON HCL 4 MG/2ML IJ SOLN
INTRAMUSCULAR | Status: DC | PRN
  Administered 2016-05-29: 4 mg via INTRAVENOUS

## 2016-05-29 MED ORDER — BUPIVACAINE HCL (PF) 0.5 % IJ SOLN
INTRAMUSCULAR | Status: AC
  Filled 2016-05-29: qty 30

## 2016-05-29 MED ORDER — LIDOCAINE HCL (CARDIAC) 20 MG/ML IV SOLN
INTRAVENOUS | Status: AC
  Filled 2016-05-29: qty 5

## 2016-05-29 MED ORDER — CEFAZOLIN SODIUM-DEXTROSE 2-4 GM/100ML-% IV SOLN
2.0000 g | INTRAVENOUS | Status: AC
Start: 2016-05-29 — End: 2016-05-29
  Administered 2016-05-29: 2 g via INTRAVENOUS
  Filled 2016-05-29: qty 100

## 2016-05-29 MED ORDER — MIDAZOLAM HCL 2 MG/2ML IJ SOLN
INTRAMUSCULAR | Status: AC
  Filled 2016-05-29: qty 2

## 2016-05-29 MED ORDER — ACETAMINOPHEN 650 MG RE SUPP
650.0000 mg | RECTAL | Status: DC | PRN
  Filled 2016-05-29: qty 1

## 2016-05-29 MED ORDER — HYDROMORPHONE HCL 1 MG/ML IJ SOLN
0.2500 mg | INTRAMUSCULAR | Status: DC | PRN
  Administered 2016-05-29 (×4): 0.5 mg via INTRAVENOUS

## 2016-05-29 MED ORDER — ACETAMINOPHEN 325 MG PO TABS: 650.0000 mg | ORAL_TABLET | ORAL | Status: DC | PRN

## 2016-05-29 MED ORDER — LACTATED RINGERS IR SOLN
Status: DC | PRN
  Administered 2016-05-29: 1000 mL

## 2016-05-29 MED ORDER — DEXAMETHASONE SODIUM PHOSPHATE 4 MG/ML IJ SOLN
INTRAMUSCULAR | Status: DC | PRN
  Administered 2016-05-29: 10 mg via INTRAVENOUS

## 2016-05-29 MED ORDER — KETOROLAC TROMETHAMINE 30 MG/ML IJ SOLN
INTRAMUSCULAR | Status: AC
  Filled 2016-05-29: qty 1

## 2016-05-29 MED ORDER — BUPIVACAINE HCL 0.5 % IJ SOLN
INTRAMUSCULAR | Status: DC | PRN
  Administered 2016-05-29: 24 mL

## 2016-05-29 MED ORDER — FENTANYL CITRATE (PF) 250 MCG/5ML IJ SOLN
INTRAMUSCULAR | Status: AC
  Filled 2016-05-29: qty 5

## 2016-05-29 MED ORDER — CEFAZOLIN SODIUM-DEXTROSE 2-4 GM/100ML-% IV SOLN
INTRAVENOUS | Status: AC
  Filled 2016-05-29: qty 100

## 2016-05-29 MED ORDER — SUGAMMADEX SODIUM 200 MG/2ML IV SOLN
INTRAVENOUS | Status: AC
  Filled 2016-05-29: qty 2

## 2016-05-29 MED ORDER — SODIUM CHLORIDE 0.9% FLUSH: 3.0000 mL | Freq: Two times a day (BID) | INTRAVENOUS | Status: DC

## 2016-05-29 MED ORDER — FENTANYL CITRATE (PF) 100 MCG/2ML IJ SOLN
INTRAMUSCULAR | Status: DC | PRN
  Administered 2016-05-29 (×2): 50 ug via INTRAVENOUS
  Administered 2016-05-29: 150 ug via INTRAVENOUS

## 2016-05-29 MED ORDER — OXYCODONE HCL 5 MG PO TABS: 5.0000 mg | ORAL_TABLET | ORAL | Status: DC | PRN

## 2016-05-29 MED ORDER — PROPOFOL 10 MG/ML IV BOLUS
INTRAVENOUS | Status: DC | PRN
  Administered 2016-05-29: 200 mg via INTRAVENOUS

## 2016-05-29 SURGICAL SUPPLY — 49 items
APL SKNCLS STERI-STRIP NONHPOA (GAUZE/BANDAGES/DRESSINGS) ×1
APPLIER CLIP 5 13 M/L LIGAMAX5 (MISCELLANEOUS) ×3
APR CLP MED LRG 5 ANG JAW (MISCELLANEOUS) ×1
BAG SPEC RTRVL 10 TROC 200 (ENDOMECHANICALS) ×1
BAG SPEC RTRVL LRG 6X4 10 (ENDOMECHANICALS) ×1
BENZOIN TINCTURE PRP APPL 2/3 (GAUZE/BANDAGES/DRESSINGS) ×3 IMPLANT
CABLE HIGH FREQUENCY MONO STRZ (ELECTRODE) ×3 IMPLANT
CATH REDDICK CHOLANGI 4FR 50CM (CATHETERS) ×3 IMPLANT
CHLORAPREP W/TINT 26ML (MISCELLANEOUS) ×3 IMPLANT
CLIP APPLIE 5 13 M/L LIGAMAX5 (MISCELLANEOUS) ×1 IMPLANT
CLOSURE WOUND 1/2 X4 (GAUZE/BANDAGES/DRESSINGS) ×1
COVER MAYO STAND STRL (DRAPES) ×3 IMPLANT
COVER SURGICAL LIGHT HANDLE (MISCELLANEOUS) ×3 IMPLANT
DECANTER SPIKE VIAL GLASS SM (MISCELLANEOUS) ×3 IMPLANT
DISSECTOR BLUNT TIP ENDO 5MM (MISCELLANEOUS) IMPLANT
DRAPE C-ARM 42X120 X-RAY (DRAPES) ×3 IMPLANT
DRAPE LAPAROSCOPIC ABDOMINAL (DRAPES) ×3 IMPLANT
DRSG TEGADERM 2-3/8X2-3/4 SM (GAUZE/BANDAGES/DRESSINGS) ×9 IMPLANT
DRSG TEGADERM 4X4.75 (GAUZE/BANDAGES/DRESSINGS) ×2 IMPLANT
ELECT REM PT RETURN 9FT ADLT (ELECTROSURGICAL) ×3
ELECTRODE REM PT RTRN 9FT ADLT (ELECTROSURGICAL) ×1 IMPLANT
ENDOLOOP SUT PDS II  0 18 (SUTURE)
ENDOLOOP SUT PDS II 0 18 (SUTURE) IMPLANT
GAUZE SPONGE 2X2 8PLY STRL LF (GAUZE/BANDAGES/DRESSINGS) ×1 IMPLANT
GLOVE ECLIPSE 8.0 STRL XLNG CF (GLOVE) ×3 IMPLANT
GLOVE INDICATOR 8.0 STRL GRN (GLOVE) ×3 IMPLANT
GOWN STRL REUS W/TWL XL LVL3 (GOWN DISPOSABLE) ×9 IMPLANT
HEMOSTAT SNOW SURGICEL 2X4 (HEMOSTASIS) IMPLANT
IV CATH 14GX2 1/4 (CATHETERS) ×3 IMPLANT
KIT BASIN OR (CUSTOM PROCEDURE TRAY) ×3 IMPLANT
POSITIONER SURGICAL ARM (MISCELLANEOUS) IMPLANT
POUCH RETRIEVAL ECOSAC 10 (ENDOMECHANICALS) IMPLANT
POUCH RETRIEVAL ECOSAC 10MM (ENDOMECHANICALS) ×2
POUCH SPECIMEN RETRIEVAL 10MM (ENDOMECHANICALS) ×3 IMPLANT
SCISSORS LAP 5X35 DISP (ENDOMECHANICALS) ×3 IMPLANT
SET IRRIG TUBING LAPAROSCOPIC (IRRIGATION / IRRIGATOR) ×3 IMPLANT
SLEEVE SURGEON STRL (DRAPES) ×2 IMPLANT
SLEEVE XCEL OPT CAN 5 100 (ENDOMECHANICALS) ×6 IMPLANT
SPONGE GAUZE 2X2 STER 10/PKG (GAUZE/BANDAGES/DRESSINGS) ×2
STRIP CLOSURE SKIN 1/2X4 (GAUZE/BANDAGES/DRESSINGS) ×2 IMPLANT
SUT MNCRL AB 4-0 PS2 18 (SUTURE) ×3 IMPLANT
TAPE CLOTH 4X10 WHT NS (GAUZE/BANDAGES/DRESSINGS) IMPLANT
TOWEL OR 17X26 10 PK STRL BLUE (TOWEL DISPOSABLE) ×3 IMPLANT
TOWEL OR NON WOVEN STRL DISP B (DISPOSABLE) IMPLANT
TRAY LAPAROSCOPIC (CUSTOM PROCEDURE TRAY) ×3 IMPLANT
TROCAR BLADELESS OPT 5 100 (ENDOMECHANICALS) ×3 IMPLANT
TROCAR XCEL BLUNT TIP 100MML (ENDOMECHANICALS) ×3 IMPLANT
TROCAR XCEL NON-BLD 11X100MML (ENDOMECHANICALS) IMPLANT
TUBING INSUF HEATED (TUBING) ×3 IMPLANT

## 2016-05-29 NOTE — Discharge Instructions (Addendum)
LAPAROSCOPIC GALLBLADDER SURGERY: POST OP INSTRUCTIONS  1. DIET: Follow a light diet the first 48 hours after arrival home, such as soup, liquids, crackers, etc.  Be sure to include lots of fluids daily.  Avoid fast food or heavy meals as your are more likely to get nauseated.  Eat a low fat diet beginning two days after surgery.   2. Take your usually prescribed home medications unless otherwise directed. 3. PAIN CONTROL: a. Pain is best controlled by a usual combination of three different methods TOGETHER: i. Ice/Heat ii. Over the counter pain medication iii. Prescription pain medication b. Most patients will experience some swelling and bruising around the incisions.  Ice packs or heating pads (30-60 minutes up to 6 times a day) will help. Use ice for the first few days to help decrease swelling and bruising, then switch to heat to help relax tight/sore spots and speed recovery.  Some people prefer to use ice alone, heat alone, alternating between ice & heat.  Experiment to what works for you.  Swelling and bruising can take several weeks to resolve.   c. It is helpful to take an over-the-counter pain medication regularly for the first few weeks.  Choose one of the following that works best for you: i. Naproxen (Aleve, etc)  Two 220mg  tabs twice a day ii. Ibuprofen (Advil, etc) Three 200mg  tabs four times a day (every meal & bedtime) iii. Acetaminophen (Tylenol, etc) 500-650mg  four times a day (every meal & bedtime) d. A  prescription for pain medication (such as oxycodone, hydrocodone, etc) should be given to you upon discharge.  Take your pain medication as prescribed.  i. If you are having problems/concerns with the prescription medicine (does not control pain, nausea, vomiting, rash, itching, etc), please call us [phone] to see if we need to switch you to a different pain medicine that will work better for you and/or control your side effect better. ii. If you need a refill on your pain  medication, please contact your pharmacy.  They will contact our office to request authorization. Prescriptions will not be filled after 5 pm or on week-ends. 4. Avoid getting constipated.  Between the surgery and the pain medications, it is common to experience some constipation.  Increasing fluid intake and taking a fiber supplement (such as Metamucil, Citrucel, FiberCon, MiraLax, etc) 1-2 times a day regularly will usually help prevent this problem from occurring.  A mild laxative (prune juice, Milk of Magnesia, MiraLax, etc) should be taken according to package directions if there are no bowel movements after 48 hours.   5. Watch out for diarrhea.  If you have many loose bowel movements, simplify your diet to bland foods & liquids for a few days.  Stop any stool softeners and decrease your fiber supplement.  Switching to mild anti-diarrheal medications (Kayopectate, Pepto Bismol) can help.  If this worsens or does not improve, please call us. 6. Wash / shower every day.  You may shower over the dressings as they are waterproof.  Continue to shower over incision(s) after the dressing is off. 7. Remove your waterproof bandages 3 days after surgery.  You may leave the incision open to air.  You may replace a dressing/Band-Aid to cover the incision for comfort if you wish.  8. ACTIVITIES as tolerated:   a. You may resume regular (light) daily activities beginning the next day--such as daily self-care, walking, climbing stairs--gradually increasing light activities as tolerated.  No heavy lifting (over 10 pounds), straining, or  intense activities for 2 weeks. b. DO NOT PUSH THROUGH PAIN.  Let pain be your guide: If it hurts to do something, don't do it.  Pain is your body warning you to avoid that activity for another week until the pain goes down. c. You may drive when you are no longer taking prescription pain medication, you can comfortably wear a seatbelt, and you can safely maneuver your car and apply  brakes. d. Dennis Bast may have sexual intercourse when it is comfortable.  9. FOLLOW UP in our office a. Please call [facility] at [phone] to set up an appointment to see your surgeon in the office for a follow-up appointment approximately 2-3 weeks after your surgery. b. Make sure that you call for this appointment the day you arrive home to insure a convenient appointment time. 10. IF YOU HAVE DISABILITY OR FAMILY LEAVE FORMS, BRING THEM TO THE OFFICE FOR PROCESSING.  DO NOT GIVE THEM TO YOUR DOCTOR.  11.  Return to work/school:  Desk work/light activities in 5-7 days, full duty/activities in 2 weeks if pain-free.   WHEN TO CALL us [phone]: 1. Poor pain control 2. Reactions / problems with new medications (rash/itching, nausea, etc)  3. Fever over 101.5 F (38.5 C) 4. Inability to urinate 5. Nausea and/or vomiting 6. Worsening swelling or bruising 7. Continued bleeding from incision. 8. Increased pain, redness, or drainage from the incision   The clinic staff is available to answer your questions during regular business hours (8:30am-5pm).  Please dont hesitate to call and ask to speak to one of our nurses for clinical concerns.   If you have a medical emergency, go to the nearest emergency room or call 911.  A surgeon from [facility] is always on call at the [facility], [place], [place], [place], [place]  [address] ? MAIN: [phone] ? TOLL FREE: [phone] ?  FAX [phone] [email or web address]  General Anesthesia, Adult, Care After Refer to this sheet in the next few weeks. These instructions provide you with information on caring for yourself after your procedure. Your health care provider may also give you more specific instructions. Your treatment has been planned according to current medical practices, but problems sometimes occur. Call your health care provider if you have any problems or questions  after your procedure. WHAT TO EXPECT AFTER THE PROCEDURE After the procedure, it is typical to experience:  Sleepiness.  Nausea and vomiting. HOME CARE INSTRUCTIONS  For the first 24 hours after general anesthesia:  Have a responsible person with you.  Do not drive a car. If you are alone, do not take public transportation.  Do not drink alcohol.  Do not take medicine that has not been prescribed by your health care provider.  Do not sign important papers or make important decisions.  You may resume a normal diet and activities as directed by your health care provider.  Change bandages (dressings) as directed.  If you have questions or problems that seem related to general anesthesia, call the hospital and ask for the anesthetist or anesthesiologist on call. SEEK MEDICAL CARE IF:  You have nausea and vomiting that continue the day after anesthesia.  You develop a rash. SEEK IMMEDIATE MEDICAL CARE IF:   You have difficulty breathing.  You have chest pain.  You have any allergic problems.   This information is not intended to replace advice given to you by your health care provider. Make sure  you discuss any questions you have with your health care provider.   Document Released: 03/25/2001 Document Revised: 01/07/2015 Document Reviewed: 04/16/2012 Elsevier Interactive Patient Education [employer].

## 2016-05-29 NOTE — Op Note (Signed)
OPERATIVE NOTE-LAPAROSCOPIC CHOLECYSTECTOMY  Preoperative diagnosis:  Symptomatic cholelithiasis  Postoperative diagnosis:  Same  Procedure: Laparoscopic cholecystectomy with cholangiogram.  Surgeon: Jackolyn Confer, M.D.  Asst.:  Alphonsa Overall, M.D.  Anesthesia: General  Indication:   This is a 40 year old female with symptomatic cholelithiasis who presents for elective cholecystectomy.  The procedure and risks have been discussed with her preoperatively.  Technique: She was brought to the operating room, placed supine on the operating table, and a general anesthetic was administered.  The abdominal wall was then sterilely prepped and draped.  A timeout was performed.    Local anesthetic (Marcaine) was infiltrated in the subumbilical region. A small subumbilical incision was made, at the sight of a transverse scar, through the skin, subcutaneous tissue, fascia, and peritoneum entering the peritoneal cavity under direct vision. A pursestring suture of 0 Vicryl was placed around the edges of the fascia. A Hassan trocar was introduced into the peritoneal cavity and a pneumoperitoneum was created by insufflation of carbon dioxide gas. The laparoscope was introduced into the trocar and no underlying bleeding or organ injury was noted.  There were omental adhesions present in the supraumbilical area.  She was then placed in the reverse Trendelenburg position with the right side tilted slightly up.  Three 5 mm trocars were then placed into the abdominal cavity under laparoscopic vision. One in the epigastric area, and 2 in the right upper quadrant area. The supraumbilical omental adhesions were separated from the abdominal wall bluntly and sharply completely exposing the [facility] trocar. The gallbladder was visualized and the fundus was grasped and retracted toward the right shoulder. Adhesions between the omentum and gallbladder were separated bluntly. Chronic inflammatory changes of the gallbladder were  present.  The infundibulum was mobilized with dissection close to the gallbladder and retracted laterally. The cystic duct was identified and a window was created around it. The cystic artery was also identified and a window was created around it. The critical view was achieved. A clip was placed at the neck of the gallbladder. A small incision was made in the cystic duct. A cholangiocatheter was introduced through the anterior abdominal wall and placed in the cystic duct. A intraoperative cholangiogram was then performed.  Under real-time fluoroscopy, dilute contrast was injected into the cystic duct.  The common hepatic duct, the right and left hepatic ducts, and the common duct were all visualized. Contrast drained into the duodenum without obvious evidence of any obstructing ductal lesion. The final report is pending the Radiologist's interpretation.  The cholangiocatheter was removed, the cystic duct was clipped 3 times on the biliary side, and then the cystic duct was divided sharply. No bile leak was noted from the cystic duct stump.  The cystic artery was then clipped and divided. Following this the gallbladder was dissected free from the liver using electrocautery. The gallbladder was then placed in a retrieval bag and removed from the abdominal cavity through the subumbilical incision.  The gallbladder fossa was inspected, irrigated, and bleeding was controlled with electrocautery. Inspection showed that hemostasis was adequate and there was no evidence of bile leak.  The irrigation fluid was evacuated as much as possible.  The subumbilical trocar was removed and the fascial defect was closed by tightening and tying down the pursestring suture under laparoscopic vision.  The remaining trocars were removed and the pneumoperitoneum was released. The skin incisions were closed with 4-0 Monocryl subcuticular stitches. Steri-Strips and sterile dressings were applied.  The procedure was  well-tolerated without any  apparent complications. She was taken to the recovery room in satisfactory condition.

## 2016-05-29 NOTE — Transfer of Care (Signed)
Immediate Anesthesia Transfer of Care Note  Patient: Teresa [name]  Procedure(s) Performed: Procedure(s): LAPAROSCOPIC CHOLECYSTECTOMY WITH INTRAOPERATIVE CHOLANGIOGRAM (N/A)  Patient Location: PACU  Anesthesia Type:General  Level of Consciousness: awake, alert  and oriented  Airway & Oxygen Therapy: Patient Spontanous Breathing and Patient connected to face mask oxygen  Post-op Assessment: Report given to RN  Post vital signs: Reviewed  Last Vitals:  Filed Vitals:   05/29/16 0552  BP: 103/62  Pulse: 86  Temp: 36.6 C  Resp: 18    Last Pain:  Filed Vitals:   05/29/16 0609  PainSc: 0-No pain      Patients Stated Pain Goal: 4 ([id number])  Complications: No apparent anesthesia complications

## 2016-05-29 NOTE — Interval H&P Note (Signed)
History and Physical Interval Note:  05/29/2016 7:19 AM  Teresa [name]  has presented today for surgery, with the diagnosis of symptomatic cholelithiasis  The various methods of treatment have been discussed with the patient and family. After consideration of risks, benefits and other options for treatment, the patient has consented to  Procedure(s): LAPAROSCOPIC CHOLECYSTECTOMY WITH INTRAOPERATIVE CHOLANGIOGRAM (N/A) as a surgical intervention .  The patient's history has been reviewed, patient examined, no change in status, stable for surgery.  I have reviewed the patient's chart and labs.  Questions were answered to the patient's satisfaction.     Viviane Semidey Lenna Sciara

## 2016-05-29 NOTE — Anesthesia Postprocedure Evaluation (Signed)
Anesthesia Post Note  Patient: Teresa [name]  Procedure(s) Performed: Procedure(s) (LRB): LAPAROSCOPIC CHOLECYSTECTOMY WITH INTRAOPERATIVE CHOLANGIOGRAM (N/A)  Patient location during evaluation: PACU Anesthesia Type: General Level of consciousness: awake and alert Pain management: pain level controlled Vital Signs Assessment: post-procedure vital signs reviewed and stable Respiratory status: spontaneous breathing, nonlabored ventilation, respiratory function stable and patient connected to nasal cannula oxygen Cardiovascular status: blood pressure returned to baseline and stable Postop Assessment: no signs of nausea or vomiting Anesthetic complications: no    Last Vitals:  Filed Vitals:   05/29/16 0910 05/29/16 0915  BP: 121/78 103/66  Pulse: 90 86  Temp: 36.5 C   Resp: 17 18    Last Pain:  Filed Vitals:   05/29/16 0928  PainSc: 6                  Zenaida Deed

## 2016-05-29 NOTE — H&P (View-Only) (Signed)
Teresa [name] 05/08/2016 1:31 PM Location: [facility] Patient #: [id number] DOB: 12/13/1976 Single / Language: Cleophus Molt / Race: White Female  History of Present Illness [name] MD; 05/08/2016 2:12 PM) The patient is a 40 year old female.   Note:She is referred by Deliah Goody, PA. For about 2 years, she's been having some epigastric discomfort and bloating intermittently followed by nausea and vomiting. This has progressively been getting worse. Typically after she eats dinner at 6:00, she notices later in the evening some significant pressure type epigastric pain that sometimes go through to the back and sometimes is associated with nausea and vomiting. She is started on a proton pump inhibitor but didn't know this any change in her symptoms. Ultrasound demonstrates multiple gallstones with the largest being 2 cm. Common bile duct diameter is normal. Occasionally when she throws up, she notices food that she ate 6 or 8 hours ago. She also has some intermittent diarrhea. Liver function tests have been normal.  Other Problems Elbert Ewings, CMA; 05/08/2016 1:31 PM) Alcohol Abuse Anxiety Disorder Arthritis Cholelithiasis Diabetes Mellitus Gastroesophageal Reflux Disease Hemorrhoids Umbilical Hernia Repair  Past Surgical History Elbert Ewings, CMA; 05/08/2016 1:31 PM) Oral Surgery  Diagnostic Studies History Elbert Ewings, [place]; 05/08/2016 1:31 PM) Colonoscopy never Mammogram within last year Pap Smear 1-5 years ago  Allergies Elbert Ewings, CMA; 05/08/2016 1:32 PM) Latex Exam Gloves *MEDICAL DEVICES AND SUPPLIES*  Medication History Elbert Ewings, CMA; 05/08/2016 1:32 PM) Zolpidem Tartrate (10MG  Tablet, Oral) Active. Pantoprazole Sodium (40MG  Tablet DR, Oral) Active. ProAir HFA (108 (90 Base)MCG/ACT Aerosol Soln, Inhalation) Active. Medications Reconciled  Social History Elbert Ewings, [place]; 05/08/2016 1:31 PM) Alcohol use Remotely quit alcohol  use. Caffeine use Coffee. Illicit drug use Remotely quit drug use. Tobacco use Current every day smoker.  Family History Elbert Ewings, [place]; 05/08/2016 1:31 PM) Alcohol Abuse Father. Arthritis Mother. Depression Father, Mother. Respiratory Condition Mother.  Pregnancy / Birth History Elbert Ewings, CMA; 05/08/2016 1:31 PM) Age at menarche 64 years. Contraceptive History Oral contraceptives. Gravida 5 Irregular periods Maternal age <15 Para 4     Review of Systems Elbert Ewings CMA; 05/08/2016 1:31 PM) General Present- Appetite Loss, Fatigue, Weight Gain and Weight Loss. Not Present- Chills, Fever and Night Sweats. Skin Not Present- Change in Wart/Mole, Dryness, Hives, Jaundice, New Lesions, Non-Healing Wounds, Rash and Ulcer. HEENT Present- Seasonal Allergies. Not Present- Earache, Hearing Loss, Hoarseness, Nose Bleed, Oral Ulcers, Ringing in the Ears, Sinus Pain, Sore Throat, Visual Disturbances, Wears glasses/contact lenses and Yellow Eyes. Respiratory Not Present- Bloody sputum, Chronic Cough, Difficulty Breathing, Snoring and Wheezing. Breast Not Present- Breast Mass, Breast Pain, Nipple Discharge and Skin Changes. Cardiovascular Present- Rapid Heart Rate and Shortness of Breath. Not Present- Chest Pain, Difficulty Breathing Lying Down, Leg Cramps, Palpitations and Swelling of Extremities. Gastrointestinal Present- Abdominal Pain, Bloating, Change in Bowel Habits, Chronic diarrhea, Constipation, Excessive gas, Hemorrhoids, Indigestion, Nausea, Rectal Pain and Vomiting. Not Present- Bloody Stool, Difficulty Swallowing and Gets full quickly at meals. Female Genitourinary Present- Urgency. Not Present- Frequency, Nocturia, Painful Urination and Pelvic Pain. Musculoskeletal Present- Back Pain, Joint Pain, Joint Stiffness and Muscle Weakness. Not Present- Muscle Pain and Swelling of Extremities. Neurological Present- Trouble walking. Not Present- Decreased Memory, Fainting,  Headaches, Numbness, Seizures, Tingling, Tremor and Weakness. Psychiatric Present- Anxiety, Bipolar and Change in Sleep Pattern. Not Present- Depression, Fearful and Frequent crying. Endocrine Not Present- Cold Intolerance, Excessive Hunger, Hair Changes, Heat Intolerance, Hot flashes and New Diabetes. Hematology Not Present- Easy Bruising, Excessive  bleeding, Gland problems, HIV and Persistent Infections.  Vitals Elbert Ewings CMA; 05/08/2016 1:33 PM) 05/08/2016 1:32 PM Weight: 186 lb Height: 65in Body Surface Area: 1.92 m Body Mass Index: 30.95 kg/m  Temp.: 98.71F  Pulse: 82 (Regular)  BP: 130/78 (Sitting, Left Arm, Standard)      Physical Exam [name] MD; 05/08/2016 2:15 PM)  The physical exam findings are as follows: Note:General: Overweight female in NAD. Pleasant and cooperative.  HEENT: [place]/AT, no facial masses  EYES: EOMI, no icterus  CV: RRR, no murmur, no JVD.  CHEST: Breath sounds equal and clear. Respirations nonlabored.  ABDOMEN: Soft, nontender, nondistended, no masses, no organomegaly, active bowel sounds, subumbilical scar with small adjacent bulge  MUSCULOSKELETAL: FROM, good muscle tone, no edema  SKIN: No jaundice or suspicious rashes.  NEUROLOGIC: Alert and oriented, answers questions appropriately, normal gait and station.  PSYCHIATRIC: Normal mood, affect , and behavior.    Assessment & Plan [name] MD; 05/08/2016 2:16 PM)  SYMPTOMATIC CHOLELITHIASIS (K80.20) Impression: Most her of her symptoms fit with this. The one thing that does not quite fit is vomiting of undigested food 6-8 hours later which is occasional. Also has a small umbilical hernia.  Plan: Laparoscopic cholecystectomy with cholangiogram, possible umbilical hernia repair with mesh. I have explained the procedure, risks, and aftercare of cholecystectomy. Risks include but are not limited to bleeding, infection, wound problems, anesthesia, diarrhea, bile  leak, injury to common bile duct/liver/intestine. She seems to understand and agrees to proceed. If she still has some vomiting of food significantly after eating, postop, she may need a gastric emptying study  Jackolyn Confer, MD

## 2016-05-29 NOTE — Anesthesia Procedure Notes (Signed)
Procedure Name: Intubation Date/Time: 05/29/2016 7:37 AM Performed by: Talbot Grumbling Pre-anesthesia Checklist: Patient identified, Emergency Drugs available, Suction available and Patient being monitored Patient Re-evaluated:Patient Re-evaluated prior to inductionOxygen Delivery Method: Circle system utilized Preoxygenation: Pre-oxygenation with 100% oxygen Intubation Type: IV induction Ventilation: Mask ventilation without difficulty Laryngoscope Size: Miller and 2 Grade View: Grade I Tube type: Oral Tube size: 7.5 mm Number of attempts: 1 Airway Equipment and Method: Stylet Placement Confirmation: ETT inserted through vocal cords under direct vision,  positive ETCO2 and breath sounds checked- equal and bilateral Secured at: 21 cm Tube secured with: Tape Dental Injury: Teeth and Oropharynx as per pre-operative assessment

## 2017-01-03 IMAGING — CR DG LUMBAR SPINE 1V
1 series · 1 of 1 positions shown · non-contrast
Comparison: MRI [DATE] .

CLINICAL DATA: Lumbar spine surgery.

EXAM:
LUMBAR SPINE - 1 VIEW

[lateral]
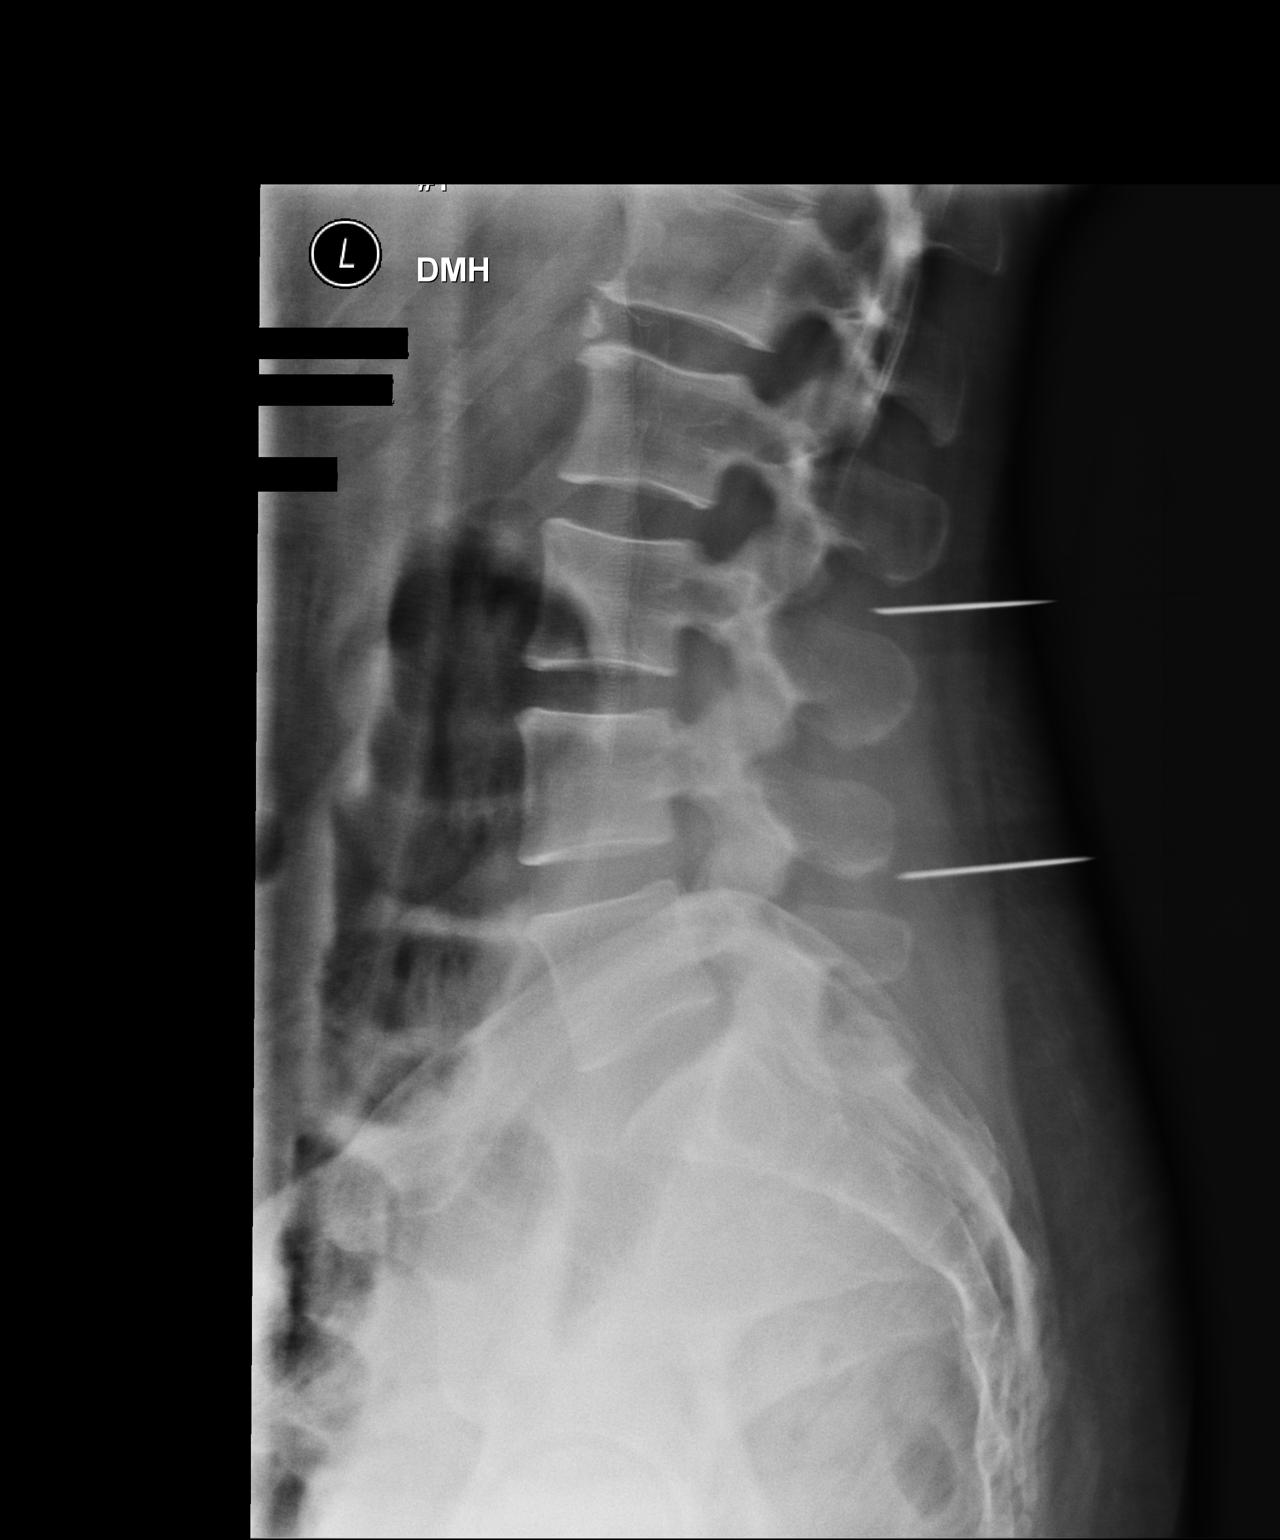

[1 of 1 positions shown; findings below may reference images not displayed]

FINDINGS: Lumbar spine numbered as per prior MRI . Metallic markers noted
posteriorly at the L3 and L5 levels.
IMPRESSION: Metallic markers noted posteriorly at the L3 and L5 levels.

## 2017-08-02 ENCOUNTER — Other Ambulatory Visit: Payer: Self-pay | Admitting: Orthopedic Surgery

## 2017-08-13 ENCOUNTER — Encounter (HOSPITAL_COMMUNITY)
Admission: RE | Admit: 2017-08-13 | Discharge: 2017-08-13 | Disposition: A | Discharge status: Home / Self Care | Payer: Worker's Compensation | Source: Ambulatory Visit | Attending: Orthopedic Surgery | Admitting: Orthopedic Surgery

## 2017-08-13 ENCOUNTER — Encounter (HOSPITAL_COMMUNITY): Payer: Self-pay

## 2017-08-13 DIAGNOSIS — F17210 Nicotine dependence, cigarettes, uncomplicated: Secondary | ICD-10-CM

## 2017-08-13 DIAGNOSIS — Z9049 Acquired absence of other specified parts of digestive tract: Secondary | ICD-10-CM

## 2017-08-13 DIAGNOSIS — Z8541 Personal history of malignant neoplasm of cervix uteri: Secondary | ICD-10-CM

## 2017-08-13 DIAGNOSIS — Z01812 Encounter for preprocedural laboratory examination: Secondary | ICD-10-CM | POA: Insufficient documentation

## 2017-08-13 DIAGNOSIS — F319 Bipolar disorder, unspecified: Secondary | ICD-10-CM | POA: Insufficient documentation

## 2017-08-13 DIAGNOSIS — J45909 Unspecified asthma, uncomplicated: Secondary | ICD-10-CM | POA: Diagnosis not present

## 2017-08-13 DIAGNOSIS — F429 Obsessive-compulsive disorder, unspecified: Secondary | ICD-10-CM | POA: Insufficient documentation

## 2017-08-13 DIAGNOSIS — F4310 Post-traumatic stress disorder, unspecified: Secondary | ICD-10-CM | POA: Insufficient documentation

## 2017-08-13 DIAGNOSIS — M79604 Pain in right leg: Secondary | ICD-10-CM | POA: Diagnosis present

## 2017-08-13 DIAGNOSIS — Z01810 Encounter for preprocedural cardiovascular examination: Secondary | ICD-10-CM | POA: Insufficient documentation

## 2017-08-13 DIAGNOSIS — Z01818 Encounter for other preprocedural examination: Secondary | ICD-10-CM

## 2017-08-13 DIAGNOSIS — Z791 Long term (current) use of non-steroidal anti-inflammatories (NSAID): Secondary | ICD-10-CM | POA: Diagnosis not present

## 2017-08-13 DIAGNOSIS — M5116 Intervertebral disc disorders with radiculopathy, lumbar region: Secondary | ICD-10-CM | POA: Diagnosis not present

## 2017-08-13 DIAGNOSIS — Z79899 Other long term (current) drug therapy: Secondary | ICD-10-CM | POA: Diagnosis not present

## 2017-08-13 HISTORY — DX: Obsessive-compulsive disorder, unspecified: F42.9

## 2017-08-13 HISTORY — DX: Post-traumatic stress disorder, unspecified: F43.10

## 2017-08-13 HISTORY — DX: Bipolar disorder, unspecified: F31.9

## 2017-08-13 HISTORY — DX: Unspecified asthma, uncomplicated: J45.909

## 2017-08-13 LAB — COMPREHENSIVE METABOLIC PANEL
ALK PHOS: 36 U/L — AB (ref 38–126)
ALT: 15 U/L (ref 14–54)
ANION GAP: 8 (ref 5–15)
AST: 16 U/L (ref 15–41)
Albumin: 4.2 g/dL (ref 3.5–5.0)
BILIRUBIN TOTAL: 0.3 mg/dL (ref 0.3–1.2)
BUN: 7 mg/dL (ref 6–20)
CALCIUM: 9.2 mg/dL (ref 8.9–10.3)
CO2: 25 mmol/L (ref 22–32)
CREATININE: 0.8 mg/dL (ref 0.44–1.00)
Chloride: 104 mmol/L (ref 101–111)
Glucose, Bld: 96 mg/dL (ref 65–99)
Potassium: 3.9 mmol/L (ref 3.5–5.1)
Sodium: 137 mmol/L (ref 135–145)
TOTAL PROTEIN: 6.7 g/dL (ref 6.5–8.1)

## 2017-08-13 LAB — CBC WITH DIFFERENTIAL/PLATELET
Basophils Absolute: 0 10*3/uL (ref 0.0–0.1)
Basophils Relative: 1 %
EOS ABS: 0.1 10*3/uL (ref 0.0–0.7)
Eosinophils Relative: 1 %
HEMATOCRIT: 40.6 % (ref 36.0–46.0)
HEMOGLOBIN: 13.5 g/dL (ref 12.0–15.0)
LYMPHS ABS: 2.9 10*3/uL (ref 0.7–4.0)
LYMPHS PCT: 38 %
MCH: 29.2 pg (ref 26.0–34.0)
MCHC: 33.3 g/dL (ref 30.0–36.0)
MCV: 87.9 fL (ref 78.0–100.0)
MONOS PCT: 6 %
Monocytes Absolute: 0.5 10*3/uL (ref 0.1–1.0)
NEUTROS ABS: 4.3 10*3/uL (ref 1.7–7.7)
NEUTROS PCT: 54 %
Platelets: 324 10*3/uL (ref 150–400)
RBC: 4.62 MIL/uL (ref 3.87–5.11)
RDW: 13.3 % (ref 11.5–15.5)
WBC: 7.7 10*3/uL (ref 4.0–10.5)

## 2017-08-13 LAB — PROTIME-INR
INR: 0.99
Prothrombin Time: 13.1 seconds (ref 11.4–15.2)

## 2017-08-13 LAB — URINALYSIS, ROUTINE W REFLEX MICROSCOPIC
BILIRUBIN URINE: NEGATIVE
GLUCOSE, UA: NEGATIVE mg/dL
HGB URINE DIPSTICK: NEGATIVE
Ketones, ur: NEGATIVE mg/dL
Leukocytes, UA: NEGATIVE
Nitrite: NEGATIVE
PROTEIN: NEGATIVE mg/dL
SPECIFIC GRAVITY, URINE: 1.021 (ref 1.005–1.030)
pH: 5 (ref 5.0–8.0)

## 2017-08-13 LAB — APTT: aPTT: 27 seconds (ref 24–36)

## 2017-08-13 LAB — SURGICAL PCR SCREEN
MRSA, PCR: NEGATIVE
Staphylococcus aureus: NEGATIVE

## 2017-08-13 NOTE — Progress Notes (Signed)
She states that back in 2014, she was experiencing chest pains, and fluctuations, came to [facility] and was sent over to [facility] (?)   A cath was done and it came back negative.  They said she was having severe panic attacks..  Denies any recent problems currently. Has not seen a cardio. She also had sleep study done at [facility] in [place] - neg results (2013) Also is recovering addict, clean for 5 yrs now.  Goes to see people at "[employer] Michela Pitcher that she was diagnosed with Bipolar, which she took a class back in 2013, learned new techniques and uses no meds.  Besides bipolar, she states she has OCD & PTSD

## 2017-08-13 NOTE — Pre-Procedure Instructions (Signed)
Teresa [name]  08/13/2017      [facility] [place], [place] [address] [place] [address] Phone: [phone] Fax: [phone]    Your procedure is scheduled on  Thursday, August 16th   Report to [facility] Admitting at  7:45 AM             (posted surgery time 9:45 am - 12:33 pm)   Call this number if you have problems the morning of surgery:  [phone], for other questions, call  [phone] Mon-Fri from 8a-4p   Remember:   Do not eat food or drink liquids after midnight Wednesday.              As of today, STOP TAKING any vitamins, herbal supplements, anti-inflammatories.   Take these medicines the morning of surgery with A SIP OF WATER : Lorazepam, Methocarbamol.  Please use your inhaler that morning.   Do not wear jewelry, make-up or nail polish.  Do not wear lotions, powders, perfumes, or deoderant.  Do not shave 48 hours prior to surgery.    Do not bring valuables to the hospital.  [facility] is not responsible for any belongings or valuables.  Contacts, dentures or bridgework may not be worn into surgery.  Leave your suitcase in the car.  After surgery it may be brought to your room.  For patients admitted to the hospital, discharge time will be determined by your treatment team.  Please read over the following fact sheets that you were given. Pain Booklet, MRSA Information and Surgical Site Infection Prevention       [facility]- Preparing For Surgery  Before surgery, you can play an important role. Because skin is not sterile, your skin needs to be as free of germs as possible. You can reduce the number of germs on your skin by washing with CHG (chlorahexidine gluconate) Soap before surgery.  CHG is an antiseptic cleaner which kills germs and bonds with the skin to continue killing germs even after washing.  Please do not use if you have an allergy  to CHG or antibacterial soaps. If your skin becomes reddened/irritated stop using the CHG.  Do not shave (including legs and underarms) for at least 48 hours prior to first CHG shower. It is OK to shave your face.  Please follow these instructions carefully.   1. Shower the NIGHT BEFORE SURGERY and the MORNING OF SURGERY with CHG.   2. If you chose to wash your hair, wash your hair first as usual with your normal shampoo.  3. After you shampoo, rinse your hair and body thoroughly to remove the shampoo.  4. Use CHG as you would any other liquid soap. You can apply CHG directly to the skin and wash gently with a scrungie or a clean washcloth.   5. Apply the CHG Soap to your body ONLY FROM THE NECK DOWN.  Do not use on open wounds or open sores. Avoid contact with your eyes, ears, mouth and genitals (private parts). Wash genitals (private parts) with your normal soap.  6. Wash thoroughly, paying special attention to the area where your surgery will be performed.  7. Thoroughly rinse your body with warm water from the neck down.  8. DO NOT shower/wash with your normal soap after using and rinsing off the CHG Soap.  9. Pat yourself dry with a CLEAN TOWEL.  10. Wear CLEAN PAJAMAS   11. Place CLEAN SHEETS on your bed the night of your first shower and DO NOT SLEEP WITH PETS.    Day of Surgery: Do not apply any deodorants/lotions. Please wear clean clothes to the hospital/surgery center.

## 2017-08-14 NOTE — Progress Notes (Signed)
Anesthesia Chart Review:  Pt is a 41 year old female scheduled for right L4-5 microdiscectomy on 08/15/2017 with Phylliss Bob, MD.   - PCP is Rochel Brome, MD  PMH includes:  cervcial cancer, bipolar disorder, PTSD, OCD. Current smoker. BMI 32. S/p cholecystectomy 05/29/16.   Medications include: soma, albuterol, vimovo.   Preoperative labs reviewed.  Pregnancy test will be done DOS.   EKG 08/13/17: NSR.  Pt reportedly had a cardiac cath ~2013 or 2014 for chest pain that was normal, dx with panic attacks. Does not see cardiologist. Attempted to get records from [facility] without success.   Denied active CV sx at PAT.  If no changes, I anticipate pt can proceed with surgery as scheduled.   Willeen Cass, FNP-BC [facility]/Anesthesiology Phone: [phone] 08/14/2017 5:13 PM

## 2017-08-15 ENCOUNTER — Ambulatory Visit (HOSPITAL_COMMUNITY): Payer: Worker's Compensation | Admitting: Emergency Medicine

## 2017-08-15 ENCOUNTER — Ambulatory Visit (HOSPITAL_COMMUNITY): Payer: Worker's Compensation

## 2017-08-15 ENCOUNTER — Encounter (HOSPITAL_COMMUNITY): Payer: Self-pay | Admitting: *Deleted

## 2017-08-15 ENCOUNTER — Ambulatory Visit (HOSPITAL_COMMUNITY)
Admission: RE | Admit: 2017-08-15 | Discharge: 2017-08-15 | Disposition: A | Discharge status: Home / Self Care | Payer: Worker's Compensation | Source: Ambulatory Visit | Attending: Orthopedic Surgery | Admitting: Orthopedic Surgery

## 2017-08-15 ENCOUNTER — Encounter (HOSPITAL_COMMUNITY)
Admission: RE | Disposition: A | Discharge status: Home / Self Care | Payer: Self-pay | Source: Ambulatory Visit | Attending: Orthopedic Surgery

## 2017-08-15 DIAGNOSIS — M5116 Intervertebral disc disorders with radiculopathy, lumbar region: Secondary | ICD-10-CM | POA: Insufficient documentation

## 2017-08-15 DIAGNOSIS — F319 Bipolar disorder, unspecified: Secondary | ICD-10-CM | POA: Insufficient documentation

## 2017-08-15 DIAGNOSIS — F17210 Nicotine dependence, cigarettes, uncomplicated: Secondary | ICD-10-CM | POA: Insufficient documentation

## 2017-08-15 DIAGNOSIS — Z79899 Other long term (current) drug therapy: Secondary | ICD-10-CM | POA: Diagnosis not present

## 2017-08-15 DIAGNOSIS — Z791 Long term (current) use of non-steroidal anti-inflammatories (NSAID): Secondary | ICD-10-CM | POA: Insufficient documentation

## 2017-08-15 DIAGNOSIS — J45909 Unspecified asthma, uncomplicated: Secondary | ICD-10-CM | POA: Insufficient documentation

## 2017-08-15 DIAGNOSIS — Z419 Encounter for procedure for purposes other than remedying health state, unspecified: Secondary | ICD-10-CM

## 2017-08-15 DIAGNOSIS — M48061 Spinal stenosis, lumbar region without neurogenic claudication: Secondary | ICD-10-CM

## 2017-08-15 HISTORY — PX: LUMBAR LAMINECTOMY: SHX95

## 2017-08-15 LAB — HCG, SERUM, QUALITATIVE: Preg, Serum: NEGATIVE

## 2017-08-15 IMAGING — RF DG C-ARM 61-120 MIN
1 series · 2 of 2 positions shown · non-contrast
Comparison: None.

CLINICAL DATA: Surgery.

EXAM:
DG C-ARM 61-120 MIN

[Series 1: run · 2 of 2 slices shown]
[im 1/2]
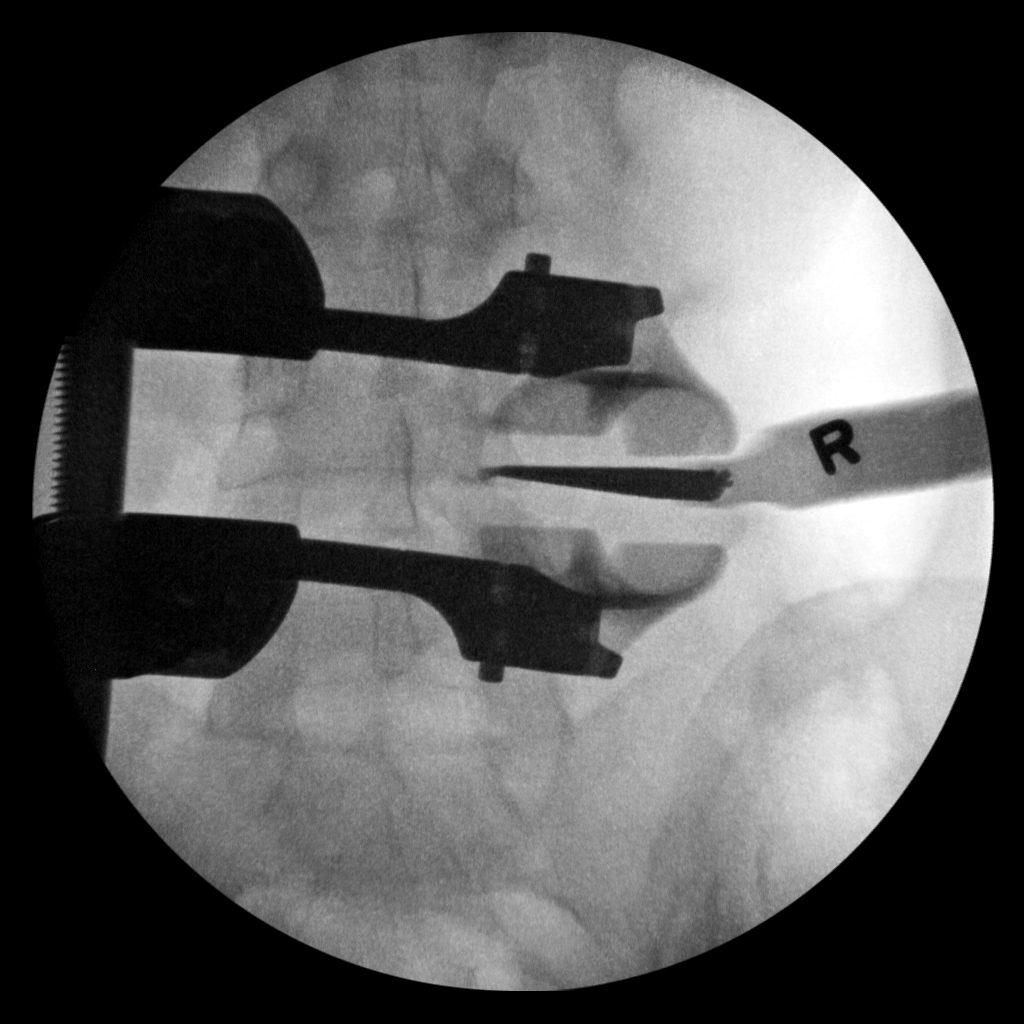
[im 2/2]
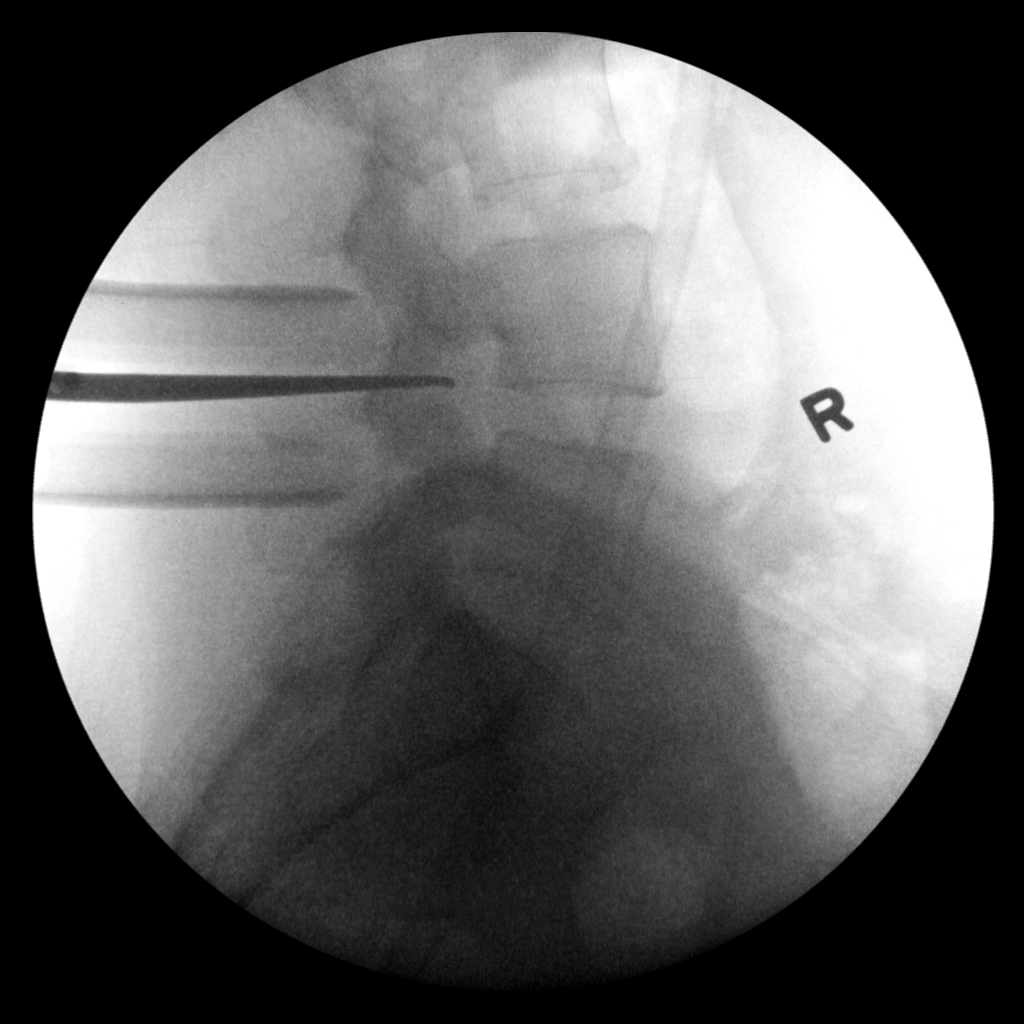

[2 of 2 positions shown; findings below may reference images not displayed]

FINDINGS: Surgical instruments are seen to the right and posteriorly at the
L4-5 level.
IMPRESSION: Surgical instruments as above.

## 2017-08-15 SURGERY — MICRODISCECTOMY LUMBAR LAMINECTOMY
Anesthesia: General | Site: Back | Laterality: Right

## 2017-08-15 MED ORDER — BUPIVACAINE LIPOSOME 1.3 % IJ SUSP
20.0000 mL | Freq: Once | INTRAMUSCULAR | Status: AC
  Administered 2017-08-15: 20 mL
  Filled 2017-08-15: qty 20

## 2017-08-15 MED ORDER — SUCCINYLCHOLINE CHLORIDE 200 MG/10ML IV SOSY
PREFILLED_SYRINGE | INTRAVENOUS | Status: AC
  Filled 2017-08-15: qty 10

## 2017-08-15 MED ORDER — SUGAMMADEX SODIUM 200 MG/2ML IV SOLN
INTRAVENOUS | Status: DC | PRN
  Administered 2017-08-15: 162 mg via INTRAVENOUS

## 2017-08-15 MED ORDER — PROMETHAZINE HCL 25 MG/ML IJ SOLN
6.2500 mg | INTRAMUSCULAR | Status: DC | PRN
  Administered 2017-08-15: 6.25 mg via INTRAVENOUS

## 2017-08-15 MED ORDER — LACTATED RINGERS IV SOLN
INTRAVENOUS | Status: DC
  Administered 2017-08-15 (×2): via INTRAVENOUS

## 2017-08-15 MED ORDER — HYDROMORPHONE HCL 1 MG/ML IJ SOLN
0.2500 mg | INTRAMUSCULAR | Status: DC | PRN
  Administered 2017-08-15 (×2): 0.5 mg via INTRAVENOUS

## 2017-08-15 MED ORDER — HYDROMORPHONE HCL 1 MG/ML IJ SOLN
INTRAMUSCULAR | Status: AC
  Administered 2017-08-15: 0.5 mg via INTRAVENOUS
  Filled 2017-08-15: qty 1

## 2017-08-15 MED ORDER — THROMBIN 20000 UNITS EX SOLR
CUTANEOUS | Status: AC
  Filled 2017-08-15: qty 20000

## 2017-08-15 MED ORDER — METHYLENE BLUE 0.5 % INJ SOLN
INTRAVENOUS | Status: AC
  Filled 2017-08-15: qty 10

## 2017-08-15 MED ORDER — PROMETHAZINE HCL 25 MG PO TABS: 25.0000 mg | ORAL_TABLET | Freq: Four times a day (QID) | ORAL | 0 refills | Status: DC | PRN

## 2017-08-15 MED ORDER — THROMBIN 20000 UNITS EX SOLR
CUTANEOUS | Status: DC | PRN
  Administered 2017-08-15: 20 mL via TOPICAL

## 2017-08-15 MED ORDER — MIDAZOLAM HCL 2 MG/2ML IJ SOLN
INTRAMUSCULAR | Status: AC
  Filled 2017-08-15: qty 2

## 2017-08-15 MED ORDER — OXYCODONE HCL 5 MG PO TABS: 5.0000 mg | ORAL_TABLET | Freq: Once | ORAL | Status: DC | PRN

## 2017-08-15 MED ORDER — ONDANSETRON HCL 4 MG/2ML IJ SOLN
INTRAMUSCULAR | Status: AC
  Filled 2017-08-15: qty 2

## 2017-08-15 MED ORDER — CEFAZOLIN SODIUM-DEXTROSE 2-4 GM/100ML-% IV SOLN
2.0000 g | INTRAVENOUS | Status: AC
  Administered 2017-08-15: 2 g via INTRAVENOUS
  Filled 2017-08-15: qty 100

## 2017-08-15 MED ORDER — PROMETHAZINE HCL 25 MG/ML IJ SOLN
INTRAMUSCULAR | Status: AC
  Administered 2017-08-15: 6.25 mg via INTRAVENOUS
  Filled 2017-08-15: qty 1

## 2017-08-15 MED ORDER — METHYLPREDNISOLONE ACETATE 40 MG/ML IJ SUSP
INTRAMUSCULAR | Status: AC
  Filled 2017-08-15: qty 1

## 2017-08-15 MED ORDER — BUPIVACAINE-EPINEPHRINE (PF) 0.25% -1:200000 IJ SOLN
INTRAMUSCULAR | Status: AC
  Filled 2017-08-15: qty 30

## 2017-08-15 MED ORDER — OXYCODONE HCL 5 MG/5ML PO SOLN: 5.0000 mg | Freq: Once | ORAL | Status: DC | PRN

## 2017-08-15 MED ORDER — PHENYLEPHRINE 40 MCG/ML (10ML) SYRINGE FOR IV PUSH (FOR BLOOD PRESSURE SUPPORT)
PREFILLED_SYRINGE | INTRAVENOUS | Status: AC
  Filled 2017-08-15: qty 10

## 2017-08-15 MED ORDER — DEXAMETHASONE SODIUM PHOSPHATE 10 MG/ML IJ SOLN
INTRAMUSCULAR | Status: DC | PRN
  Administered 2017-08-15: 10 mg via INTRAVENOUS

## 2017-08-15 MED ORDER — LIDOCAINE HCL (CARDIAC) 20 MG/ML IV SOLN
INTRAVENOUS | Status: DC | PRN
  Administered 2017-08-15: 60 mg via INTRAVENOUS

## 2017-08-15 MED ORDER — CHLORHEXIDINE GLUCONATE 4 % EX LIQD: 60.0000 mL | Freq: Once | CUTANEOUS | Status: DC

## 2017-08-15 MED ORDER — ROCURONIUM BROMIDE 100 MG/10ML IV SOLN
INTRAVENOUS | Status: DC | PRN
  Administered 2017-08-15: 50 mg via INTRAVENOUS
  Administered 2017-08-15: 20 mg via INTRAVENOUS

## 2017-08-15 MED ORDER — METHYLPREDNISOLONE ACETATE 40 MG/ML IJ SUSP
INTRAMUSCULAR | Status: DC | PRN
  Administered 2017-08-15: 40 mg

## 2017-08-15 MED ORDER — ONDANSETRON HCL 4 MG/2ML IJ SOLN
INTRAMUSCULAR | Status: DC | PRN
  Administered 2017-08-15: 4 mg via INTRAVENOUS

## 2017-08-15 MED ORDER — PROPOFOL 10 MG/ML IV BOLUS
INTRAVENOUS | Status: AC
  Filled 2017-08-15: qty 40

## 2017-08-15 MED ORDER — FENTANYL CITRATE (PF) 250 MCG/5ML IJ SOLN
INTRAMUSCULAR | Status: AC
  Filled 2017-08-15: qty 5

## 2017-08-15 MED ORDER — ONDANSETRON HCL 4 MG/2ML IJ SOLN
4.0000 mg | Freq: Four times a day (QID) | INTRAMUSCULAR | Status: AC | PRN
  Administered 2017-08-15: 4 mg via INTRAVENOUS

## 2017-08-15 MED ORDER — MIDAZOLAM HCL 5 MG/5ML IJ SOLN
INTRAMUSCULAR | Status: DC | PRN
  Administered 2017-08-15: 2 mg via INTRAVENOUS

## 2017-08-15 MED ORDER — FENTANYL CITRATE (PF) 100 MCG/2ML IJ SOLN
INTRAMUSCULAR | Status: DC | PRN
  Administered 2017-08-15: 75 ug via INTRAVENOUS
  Administered 2017-08-15 (×2): 50 ug via INTRAVENOUS
  Administered 2017-08-15: 75 ug via INTRAVENOUS
  Administered 2017-08-15 (×5): 50 ug via INTRAVENOUS

## 2017-08-15 MED ORDER — EPHEDRINE 5 MG/ML INJ
INTRAVENOUS | Status: AC
  Filled 2017-08-15: qty 10

## 2017-08-15 MED ORDER — PROPOFOL 10 MG/ML IV BOLUS
INTRAVENOUS | Status: DC | PRN
  Administered 2017-08-15: 200 mg via INTRAVENOUS

## 2017-08-15 MED ORDER — ROCURONIUM BROMIDE 10 MG/ML (PF) SYRINGE
PREFILLED_SYRINGE | INTRAVENOUS | Status: AC
  Filled 2017-08-15: qty 5

## 2017-08-15 MED ORDER — LIDOCAINE 2% (20 MG/ML) 5 ML SYRINGE
INTRAMUSCULAR | Status: AC
  Filled 2017-08-15: qty 5

## 2017-08-15 MED ORDER — MIDAZOLAM HCL 2 MG/2ML IJ SOLN
2.0000 mg | Freq: Once | INTRAMUSCULAR | Status: AC
  Administered 2017-08-15: 2 mg via INTRAVENOUS

## 2017-08-15 MED ORDER — PROMETHAZINE HCL 25 MG PO TABS: 25.0000 mg | ORAL_TABLET | Freq: Four times a day (QID) | ORAL | Status: DC | PRN

## 2017-08-15 MED ORDER — ARTIFICIAL TEARS OPHTHALMIC OINT
TOPICAL_OINTMENT | OPHTHALMIC | Status: DC | PRN
  Administered 2017-08-15: 1 via OPHTHALMIC

## 2017-08-15 MED ORDER — 0.9 % SODIUM CHLORIDE (POUR BTL) OPTIME
TOPICAL | Status: DC | PRN
  Administered 2017-08-15: 1000 mL

## 2017-08-15 MED ORDER — BUPIVACAINE-EPINEPHRINE 0.25% -1:200000 IJ SOLN
INTRAMUSCULAR | Status: DC | PRN
  Administered 2017-08-15: 20 mL
  Administered 2017-08-15: 8 mL

## 2017-08-15 SURGICAL SUPPLY — 82 items
APL SKNCLS STERI-STRIP NONHPOA (GAUZE/BANDAGES/DRESSINGS) ×1
BENZOIN TINCTURE PRP APPL 2/3 (GAUZE/BANDAGES/DRESSINGS) ×2 IMPLANT
BUR MATCHSTICK NEURO 3.0 LAGG (BURR) ×2 IMPLANT
BUR ROUND PRECISION 4.0 (BURR) ×2 IMPLANT
BUR ROUND PRECISION 4.0MM (BURR) ×1
CANISTER SUCT 3000ML PPV (MISCELLANEOUS) ×3 IMPLANT
CLOSURE WOUND 1/2 X4 (GAUZE/BANDAGES/DRESSINGS) ×1
CONT SPEC 4OZ CLIKSEAL STRL BL (MISCELLANEOUS) ×3 IMPLANT
CORD BIPOLAR FORCEPS 12FT (ELECTRODE) ×2 IMPLANT
COVER SURGICAL LIGHT HANDLE (MISCELLANEOUS) ×3 IMPLANT
DRAIN CHANNEL 10F 3/8 F FF (DRAIN) IMPLANT
DRAPE C-ARM 35X43 STRL (DRAPES) ×4 IMPLANT
DRAPE C-ARMOR (DRAPES) ×2 IMPLANT
DRAPE INCISE IOBAN 66X45 STRL (DRAPES) ×2 IMPLANT
DRAPE POUCH INSTRU U-SHP 10X18 (DRAPES) ×6 IMPLANT
DRAPE SURG 17X23 STRL (DRAPES) ×12 IMPLANT
DURAPREP 26ML APPLICATOR (WOUND CARE) ×3 IMPLANT
ELECT BLADE 4.0 EZ CLEAN MEGAD (MISCELLANEOUS) ×3
ELECT BLADE 6.5 EXT (BLADE) IMPLANT
ELECT CAUTERY BLADE 6.4 (BLADE) ×3 IMPLANT
ELECT REM PT RETURN 9FT ADLT (ELECTROSURGICAL) ×3
ELECTRODE BLDE 4.0 EZ CLN MEGD (MISCELLANEOUS) IMPLANT
ELECTRODE REM PT RTRN 9FT ADLT (ELECTROSURGICAL) ×1 IMPLANT
EVACUATOR SILICONE 100CC (DRAIN) IMPLANT
FILTER STRAW FLUID ASPIR (MISCELLANEOUS) ×5 IMPLANT
GAUZE SPONGE 4X4 12PLY STRL (GAUZE/BANDAGES/DRESSINGS) ×3 IMPLANT
GAUZE SPONGE 4X4 12PLY STRL LF (GAUZE/BANDAGES/DRESSINGS) ×2 IMPLANT
GAUZE SPONGE 4X4 16PLY XRAY LF (GAUZE/BANDAGES/DRESSINGS) ×8 IMPLANT
GLOVE BIO SURGEON STRL SZ7 (GLOVE) ×3 IMPLANT
GLOVE BIO SURGEON STRL SZ8 (GLOVE) ×3 IMPLANT
GLOVE BIOGEL PI IND STRL 7.0 (GLOVE) ×1 IMPLANT
GLOVE BIOGEL PI IND STRL 8 (GLOVE) ×1 IMPLANT
GLOVE BIOGEL PI INDICATOR 7.0 (GLOVE) ×4
GLOVE BIOGEL PI INDICATOR 8 (GLOVE) ×2
GLOVE SURG SS PI 7.0 STRL IVOR (GLOVE) ×2 IMPLANT
GOWN STRL REUS W/ TWL LRG LVL3 (GOWN DISPOSABLE) ×2 IMPLANT
GOWN STRL REUS W/ TWL XL LVL3 (GOWN DISPOSABLE) IMPLANT
GOWN STRL REUS W/TWL LRG LVL3 (GOWN DISPOSABLE) ×9
GOWN STRL REUS W/TWL XL LVL3 (GOWN DISPOSABLE) ×6
IV CATH 14GX2 1/4 (CATHETERS) ×3 IMPLANT
KIT BASIN OR (CUSTOM PROCEDURE TRAY) ×3 IMPLANT
KIT ROOM TURNOVER OR (KITS) ×3 IMPLANT
MARKER SKIN DUAL TIP RULER LAB (MISCELLANEOUS) ×2 IMPLANT
NDL 18GX1X1/2 (RX/OR ONLY) (NEEDLE) ×1 IMPLANT
NDL HYPO 21X1.5 SAFETY (NEEDLE) IMPLANT
NDL HYPO 25GX1X1/2 BEV (NEEDLE) ×1 IMPLANT
NDL SPNL 18GX3.5 QUINCKE PK (NEEDLE) ×2 IMPLANT
NDL SPNL 22GX3.5 QUINCKE BK (NEEDLE) IMPLANT
NEEDLE 18GX1X1/2 (RX/OR ONLY) (NEEDLE) ×3 IMPLANT
NEEDLE HYPO 21X1.5 SAFETY (NEEDLE) ×3 IMPLANT
NEEDLE HYPO 25GX1X1/2 BEV (NEEDLE) ×3 IMPLANT
NEEDLE SPNL 18GX3.5 QUINCKE PK (NEEDLE) ×6 IMPLANT
NEEDLE SPNL 22GX3.5 QUINCKE BK (NEEDLE) IMPLANT
NS IRRIG 1000ML POUR BTL (IV SOLUTION) ×3 IMPLANT
PACK LAMINECTOMY ORTHO (CUSTOM PROCEDURE TRAY) ×3 IMPLANT
PACK UNIVERSAL I (CUSTOM PROCEDURE TRAY) ×3 IMPLANT
PAD ARMBOARD 7.5X6 YLW CONV (MISCELLANEOUS) ×6 IMPLANT
PATTIES SURGICAL .5 X.5 (GAUZE/BANDAGES/DRESSINGS) IMPLANT
PATTIES SURGICAL .5 X1 (DISPOSABLE) ×5 IMPLANT
PATTIES SURGICAL 1X1 (DISPOSABLE) IMPLANT
SPONGE SURGIFOAM ABS GEL 100 (HEMOSTASIS) ×3 IMPLANT
STRIP CLOSURE SKIN 1/2X4 (GAUZE/BANDAGES/DRESSINGS) ×1 IMPLANT
SUT ETHILON 3 0 FSL (SUTURE) IMPLANT
SUT MNCRL AB 4-0 PS2 18 (SUTURE) ×2 IMPLANT
SUT VIC AB 0 CT1 27 (SUTURE)
SUT VIC AB 0 CT1 27XBRD ANBCTR (SUTURE) IMPLANT
SUT VIC AB 0 CT2 27 (SUTURE) ×3 IMPLANT
SUT VIC AB 1 CT1 18XCR BRD 8 (SUTURE) ×1 IMPLANT
SUT VIC AB 1 CT1 8-18 (SUTURE) ×3
SUT VIC AB 2-0 CT2 18 VCP726D (SUTURE) ×3 IMPLANT
SYR 20CC LL (SYRINGE) IMPLANT
SYR 30ML LL (SYRINGE) ×2 IMPLANT
SYR 3ML LL SCALE MARK (SYRINGE) ×2 IMPLANT
SYR BULB IRRIGATION 50ML (SYRINGE) ×3 IMPLANT
SYR CONTROL 10ML LL (SYRINGE) ×3 IMPLANT
SYR TB 1ML 26GX3/8 SAFETY (SYRINGE) ×6 IMPLANT
SYR TB 1ML LUER SLIP (SYRINGE) ×8 IMPLANT
TAPE CLOTH SURG 4X10 WHT LF (GAUZE/BANDAGES/DRESSINGS) ×2 IMPLANT
TOWEL OR 17X24 6PK STRL BLUE (TOWEL DISPOSABLE) ×3 IMPLANT
TOWEL OR 17X26 10 PK STRL BLUE (TOWEL DISPOSABLE) ×3 IMPLANT
WATER STERILE IRR 1000ML POUR (IV SOLUTION) ×3 IMPLANT
YANKAUER SUCT BULB TIP NO VENT (SUCTIONS) ×3 IMPLANT

## 2017-08-15 NOTE — H&P (Signed)
PREOPERATIVE H&P  Chief Complaint: Right leg pain  HPI: Teresa [name] is a 40 y.o. female who presents with ongoing pain in the right leg  MRI reveals a small broad-based R foraminal HNP, minimally displacing the R L4 nerve. Patient did get temp relief with a R L4 SNRB. Patient was not able to proceed with an EMG/NCS.  Patient has failed multiple forms of conservative care and continues to have pain (see office notes for additional details regarding the patient's full course of treatment)  Past Medical History:  Diagnosis Date  . Anxiety   . Arthritis    knees   . Asthma due to seasonal allergies   . Bipolar disorder ([place])   . Cancer (HCC)    hx of cervical cancer- laser surgery done   . Carpal tunnel syndrome    bilateral   . GERD (gastroesophageal reflux disease)   . OCD (obsessive compulsive disorder)    2013  . PTSD (post-traumatic stress disorder)    2013  . Shortness of breath dyspnea    allergy related   . Trigger finger    bilateral middle fingers    Past Surgical History:  Procedure Laterality Date  . CARDIAC CATHETERIZATION    . CHOLECYSTECTOMY N/A 05/29/2016   Procedure: LAPAROSCOPIC CHOLECYSTECTOMY WITH INTRAOPERATIVE CHOLANGIOGRAM;  Surgeon: Jackolyn Confer, MD;  Location: [facility] ORS;  Service: General;  Laterality: N/A;  . TUBAL LIGATION    . WISDOM TOOTH EXTRACTION     Social History   Social History  . Marital status: Single    Spouse name: N/A  . Number of children: N/A  . Years of education: N/A   Social History Main Topics  . Smoking status: Current Every Day Smoker    Packs/day: 1.50    Years: 24.00    Types: Cigarettes  . Smokeless tobacco: Never Used  . Alcohol use No     Comment: > 4 years ago   . Drug use: No     Comment: > 4 years ago + marijuana   . Sexual activity: Not on file   Other Topics Concern  . Not on file   Social History Narrative  . No narrative on file   No family history on file. Allergies  Allergen  Reactions  . Latex Other (See Comments)    Skin blisters, becomes red and swollen  . Vicodin [Hydrocodone-Acetaminophen] Itching   Prior to Admission medications   Medication Sig Start Date End Date Taking? Authorizing Provider  acetaminophen-codeine (TYLENOL #3) 300-30 MG tablet Take 2 tablets by mouth 3 (three) times daily.   Yes [provider]  carisoprodol (SOMA) 350 MG tablet Take 350 mg by mouth at bedtime.   Yes [provider]  citalopram (CELEXA) 20 MG tablet Take 20 mg by mouth at bedtime.   Yes [provider]  LORazepam (ATIVAN) 0.5 MG tablet Take 0.5 mg by mouth daily as needed for anxiety.   Yes [provider]  methocarbamol (ROBAXIN) 500 MG tablet Take 1,000 mg by mouth 3 (three) times daily.   Yes [provider]  PROAIR HFA 108 (90 Base) MCG/ACT inhaler Inhale 2 puffs into the lungs every 6 (six) hours as needed for wheezing or shortness of breath.  04/24/16  Yes [provider]  ondansetron (ZOFRAN) 4 MG tablet Take 1 tablet (4 mg total) by mouth every 4 (four) hours as needed for nausea or vomiting. Patient not taking: Reported on 08/08/2017 05/29/16   Rosenbower,  Sherren Mocha, MD  oxyCODONE (OXY IR/ROXICODONE) 5 MG immediate release tablet Take 1-2 tablets (5-10 mg total) by mouth every 4 (four) hours as needed for moderate pain, severe pain or breakthrough pain. Patient not taking: Reported on 08/08/2017 05/29/16   Jackolyn Confer, MD  VIMOVO 500-20 MG TBEC Take 1 tablet by mouth 2 (two) times daily. 06/11/17   [provider]     All other systems have been reviewed and were otherwise negative with the exception of those mentioned in the HPI and as above.  Physical Exam: Vitals:   08/15/17 0714  BP: 90/61  Resp: 18  Temp: 97.8 F (36.6 C)  SpO2: 96%    General: Alert, no acute distress Cardiovascular: No pedal edema Respiratory: No cyanosis, no use of accessory musculature Skin: No lesions in the area of chief  complaint Neurologic: Sensation intact distally Psychiatric: Patient is competent for consent with normal mood and affect Lymphatic: No axillary or cervical lymphadenopathy  MUSCULOSKELETAL: + SLR ON THE RIGHT  Assessment/Plan: RIGHT LEG PAIN  Plan for Procedure(s): RIGHT SIDED LUMBAR 4-5 TRANSPEDICULAR MICRODISCECTOMY.   Sinclair Ship, MD 08/15/2017 7:28 AM

## 2017-08-15 NOTE — Anesthesia Postprocedure Evaluation (Signed)
Anesthesia Post Note  Patient: Teresa [name]  Procedure(s) Performed: Procedure(s) (LRB): RIGHT SIDED LUMBAR FOUR-FIVE  MICRODISCECTOMY. (Right)     Patient location during evaluation: PACU Anesthesia Type: General Level of consciousness: awake and alert and patient cooperative Pain management: pain level controlled Vital Signs Assessment: post-procedure vital signs reviewed and stable Respiratory status: spontaneous breathing and respiratory function stable Cardiovascular status: stable Anesthetic complications: no    Last Vitals:  Vitals:   08/15/17 1444 08/15/17 1445  BP: (!) 100/59   Pulse: 76 74  Resp: 20 17  Temp: (!) 36.1 C   SpO2: 95% 96%    Last Pain:  Vitals:   08/15/17 1443  TempSrc:   PainSc: [name]

## 2017-08-15 NOTE — Anesthesia Procedure Notes (Signed)
Procedure Name: Intubation Date/Time: 08/15/2017 10:19 AM Performed by: Willeen Cass P Pre-anesthesia Checklist: Patient identified, Emergency Drugs available, Suction available and Patient being monitored Patient Re-evaluated:Patient Re-evaluated prior to induction Oxygen Delivery Method: Circle System Utilized Preoxygenation: Pre-oxygenation with 100% oxygen Induction Type: IV induction Ventilation: Mask ventilation without difficulty Laryngoscope Size: Mac and 3 Grade View: Grade I Tube type: Oral Tube size: 7.0 mm Number of attempts: 1 Airway Equipment and Method: Stylet and Oral airway Placement Confirmation: ETT inserted through vocal cords under direct vision,  positive ETCO2 and breath sounds checked- equal and bilateral Secured at: 22 cm Tube secured with: Tape Dental Injury: Teeth and Oropharynx as per pre-operative assessment

## 2017-08-15 NOTE — Progress Notes (Signed)
"  I got to find another position here", then doze back to sleep then when she wakes up will say "i'm ready to go"

## 2017-08-15 NOTE — Anesthesia Preprocedure Evaluation (Signed)
Anesthesia Evaluation  Patient identified by MRN, date of birth, ID band Patient awake    Reviewed: Allergy & Precautions, H&P , NPO status , Patient's Chart, lab work & pertinent test results  Airway Mallampati: II   Neck ROM: full    Dental   Pulmonary shortness of breath, asthma , Current Smoker,    breath sounds clear to auscultation       Cardiovascular negative cardio ROS   Rhythm:regular Rate:Normal     Neuro/Psych PSYCHIATRIC DISORDERS Anxiety Bipolar Disorder  Neuromuscular disease    GI/Hepatic GERD  ,  Endo/Other    Renal/GU      Musculoskeletal  (+) Arthritis ,   Abdominal   Peds  Hematology   Anesthesia Other Findings   Reproductive/Obstetrics                             Anesthesia Physical Anesthesia Plan  ASA: II  Anesthesia Plan: General   Post-op Pain Management:    Induction: Intravenous  PONV Risk Score and Plan: 2 and Ondansetron, Dexamethasone and Treatment may vary due to age or medical condition  Airway Management Planned: Oral ETT  Additional Equipment:   Intra-op Plan:   Post-operative Plan:   Informed Consent: I have reviewed the patients History and Physical, chart, labs and discussed the procedure including the risks, benefits and alternatives for the proposed anesthesia with the patient or authorized representative who has indicated his/her understanding and acceptance.     Plan Discussed with: CRNA, Anesthesiologist and Surgeon  Anesthesia Plan Comments:         Anesthesia Quick Evaluation

## 2017-08-15 NOTE — Progress Notes (Signed)
"  I'm ready to go home", "I was want out". Explained to patient need to keep her little longer since she had nausea medicine. She keeps saying, she just want to go and see her "person".

## 2017-08-15 NOTE — Transfer of Care (Signed)
Immediate Anesthesia Transfer of Care Note  Patient: Teresa [name]  Procedure(s) Performed: Procedure(s): RIGHT SIDED LUMBAR FOUR-FIVE  MICRODISCECTOMY. (Right)  Patient Location: PACU  Anesthesia Type:General  Level of Consciousness: awake, alert , oriented and patient cooperative  Airway & Oxygen Therapy: Patient Spontanous Breathing  Post-op Assessment: Report given to RN, Post -op Vital signs reviewed and stable and Patient moving all extremities X 4  Post vital signs: Reviewed and stable, nausea upon arrival to PACU.  Last Vitals:  Vitals:   08/15/17 1358 08/15/17 1400  BP: (!) 113/94 (!) 113/94  Pulse: 90 91  Resp: (!) 25 16  Temp: (!) 36.3 C   SpO2: 97% 97%    Last Pain:  Vitals:   08/15/17 1358  TempSrc:   PainSc: 0-No pain      Patients Stated Pain Goal: 2 (04/59/97 7414)  Complications: No apparent anesthesia complications

## 2017-08-16 ENCOUNTER — Encounter (HOSPITAL_COMMUNITY): Payer: Self-pay | Admitting: Orthopedic Surgery

## 2017-08-16 NOTE — Op Note (Signed)
NAME:  Teresa [name], Teresa [name]                    ACCOUNT NO.:  MEDICAL RECORD NO.:  [id number]  PHYSICIAN:  Phylliss Bob, MD           DATE OF BIRTH:  DATE OF PROCEDURE:  08/15/2017                              OPERATIVE REPORT   PREOPERATIVE DIAGNOSES: 1. Right-sided L4 radiculopathy. 2. Small broad-based right-sided, L4-L5 foraminal disk protrusion,     contacting the right L4 nerve.  POSTOPERATIVE DIAGNOSES: 1. Right-sided L4 radiculopathy. 2. Small broad-based right-sided, L4-L5 foraminal disk protrusion,     contacting the right L4 nerve.  PROCEDURE:  Right-sided L4-5 transpedicular decompression with removal of small broad-based right-sided L4-5 disk protrusion.  SURGEON:  Phylliss Bob, MD.  ASSISTANTPricilla Holm, PA-C.  ANESTHESIA:  General endotracheal anesthesia.  COMPLICATIONS:  None.  DISPOSITION:  Stable.  ESTIMATED BLOOD LOSS:  Minimal.  INDICATIONS FOR SURGERY:  Briefly, Teresa [name] is a pleasant 41 year old female, who did present to me with pain in her back, which did develop into pain in her right leg.  The patient's pain did appear consistent with right L4 radiculopathy.  Of note, her MRI did reveal a small broad- based protrusion.  Of particular note, it was unclear as to whether her pain was specifically related to the protrusion, as it was noted to be rather small.  I did get her set up with a right L4 nerve block, and she did report 1 week of very substantial relief in her right leg symptoms. To additionally confirm the presence or absence of right L4 radiculopathy, I did also order an EMG and nerve conduction test.  The patient was not able to proceed with the test, and per the examiner, the test was not able to exclude the possibility of radiculopathy.  She clearly had failed all forms of nonoperative measures and did feel that her pain continued to be severe and debilitating.  I did therefore discuss with her the risks and potential benefits of an  L4-5 decompression with removal of the disk protrusion.  Of note, I was very guarded with the patient, and did very clearly expressed to her that surgery, although likely to address her leg pain, was not a guaranteed way to resolve her leg pain.  Again, I did express to her my thoughts outlined above, specifically, that the protrusion was rather small, but given her response to her injection, and the location of her pain, I did feel it was reasonable to proceed with surgery with the hopes of addressing her ongoing debilitating right leg pain.  She did wish to proceed.  OPERATIVE DETAILS:  On August 15, 2017, the patient was brought to surgery and general endotracheal anesthesia was administered.  The patient was placed prone on a well-padded flat Jackson bed with a Eye frame.  Antibiotics were given and a time-out procedure was performed. The back was prepped and draped and a right-sided paramedian incision was made approximately 2 cm lateral to the midline.  The fascia was incised and a self-retaining retractor was docked over the L4-L5 facet joint and L4 lamina on the right side.  The retractor was then slightly opened.  I then proceeded with a partial facetectomy on the right side at L4-5 with minimal removal of the lateral aspect of the L4 pars  interarticularis.  The exiting L4 nerve was readily identified.  I then gently mobilized the nerve superiorly and laterally.  The broad-based protrusion was noted immediately ventral to the nerve.  Of note, there were no clear herniated disk fragments; however, there was a protrusion noted on the MRI.  I did use a 15 blade knife to perform an annulotomy into the region of the protrusion.  Of note, I did liberally use fluoroscopy in order to ensure that the annulotomy was at the location of the protrusion noted on the MRI.  I then used a series of Epstein curettes to displace the protruding fragments into the intervertebral space, after  which point they were removed.  In doing so, I was able to entirely decompress the right L4 nerve.  The wound was then copiously irrigated.  All bleeding was controlled.  The wound was then closed in layers using #1 Vicryl, followed by 2-0 Vicryl, followed by 4-0 Monocryl.  Benzoin and Steri-Strips were applied, followed by sterile dressing.  All instrument counts were correct at the termination of the procedure.  Of note, Pricilla Holm, PA-C, was my assistant throughout surgery, and did aid in retraction, suctioning, and closure from start to finish.     Phylliss Bob, MD     MD/MEDQ  D:  08/15/2017  T:  08/15/2017  Job:  [id number]

## 2017-11-08 ENCOUNTER — Other Ambulatory Visit: Payer: Self-pay | Admitting: Orthopedic Surgery

## 2017-11-08 DIAGNOSIS — M533 Sacrococcygeal disorders, not elsewhere classified: Secondary | ICD-10-CM

## 2017-11-18 ENCOUNTER — Ambulatory Visit
Admission: RE | Admit: 2017-11-18 | Discharge: 2017-11-18 | Disposition: A | Discharge status: Home / Self Care | Payer: Worker's Compensation | Source: Ambulatory Visit | Attending: Orthopedic Surgery | Admitting: Orthopedic Surgery

## 2017-11-18 DIAGNOSIS — M533 Sacrococcygeal disorders, not elsewhere classified: Secondary | ICD-10-CM

## 2017-11-18 IMAGING — CT CT BIOPSY
3 of 5 series · 14 of 32 positions shown, 19 images · non-contrast
Comparison: none

INDICATION: Right low back pain

[Series 2: needle -guided injection · axial · 0.87mm/px · z∈[-105,-53]mm · 6 of 38 slices shown, 11 images (1 of 3)]
[im 6/38  soft-tissue]
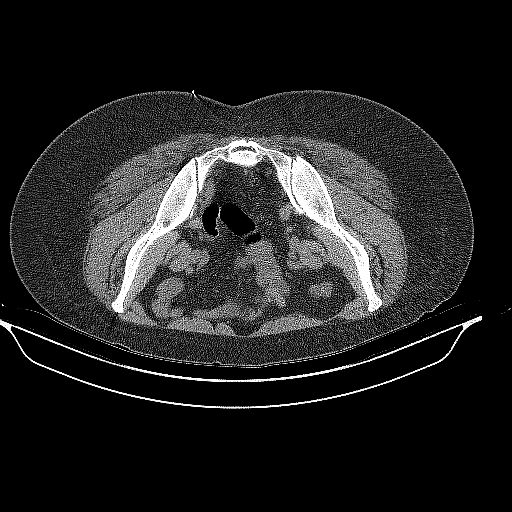
[im 6/38  bone]
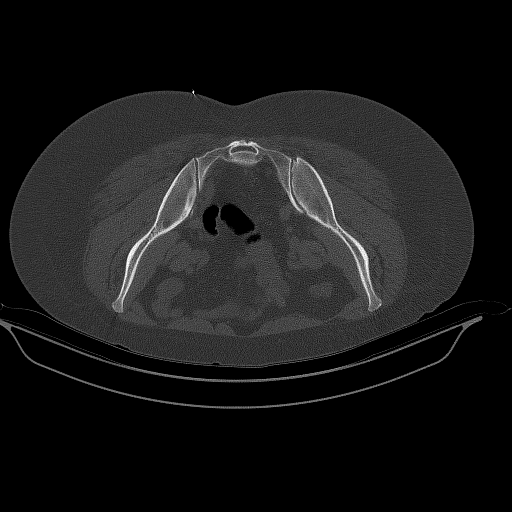
[im 11/38  soft-tissue]
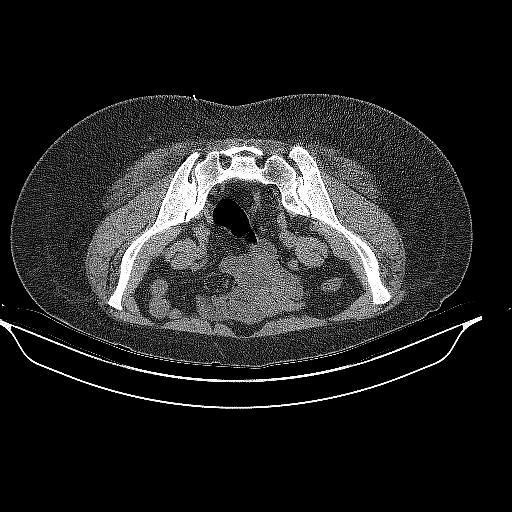
[im 16/38  soft-tissue]
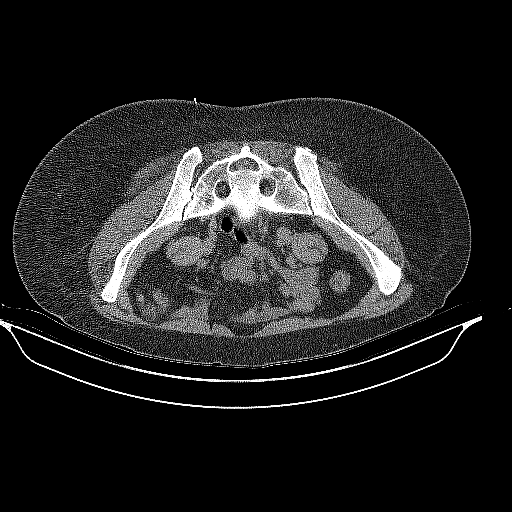
[im 16/38  lung]
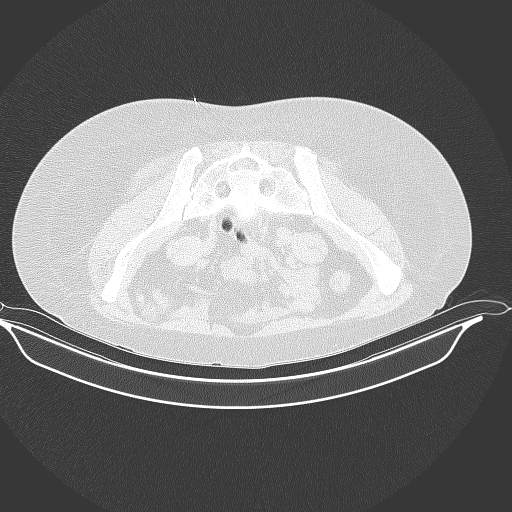
[im 22/38  soft-tissue]
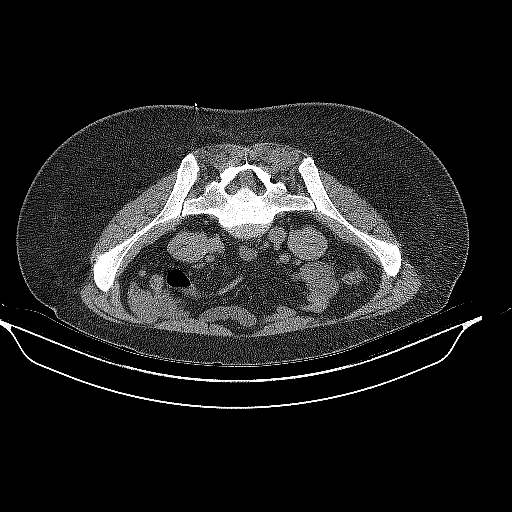
[im 22/38  lung]
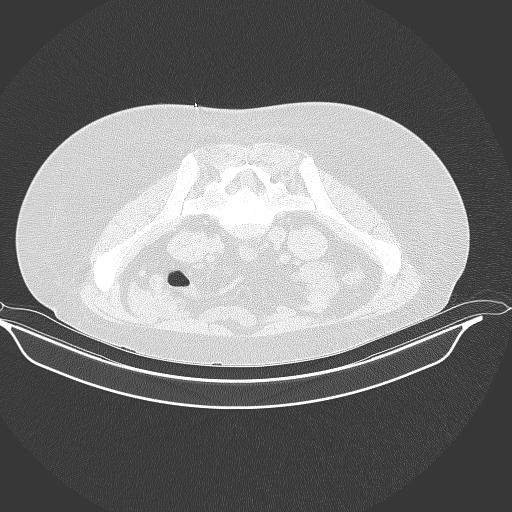
[im 27/38  soft-tissue]
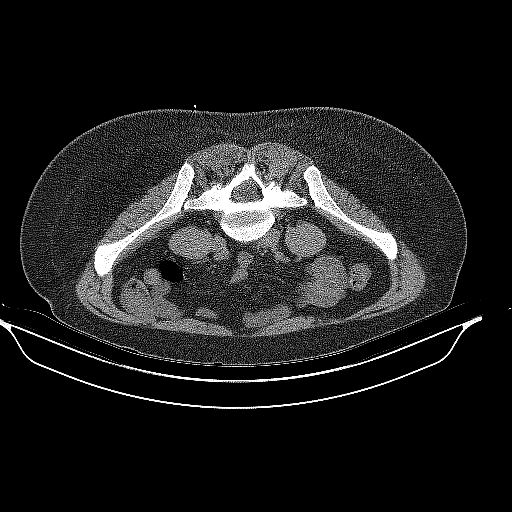
[im 27/38  lung]
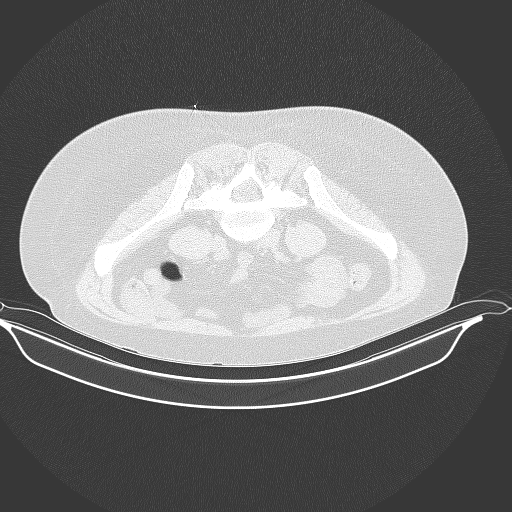
[im 32/38  soft-tissue]
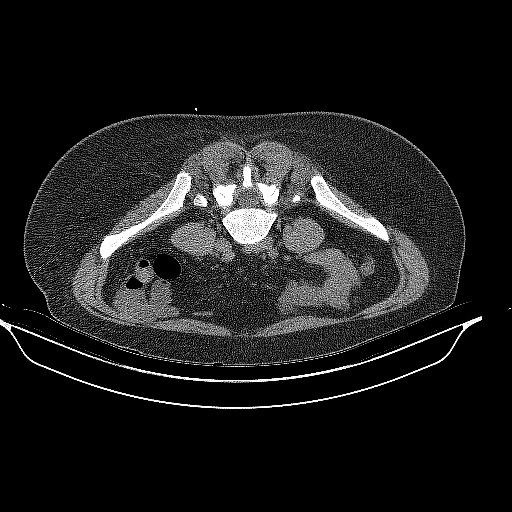
[im 32/38  lung]
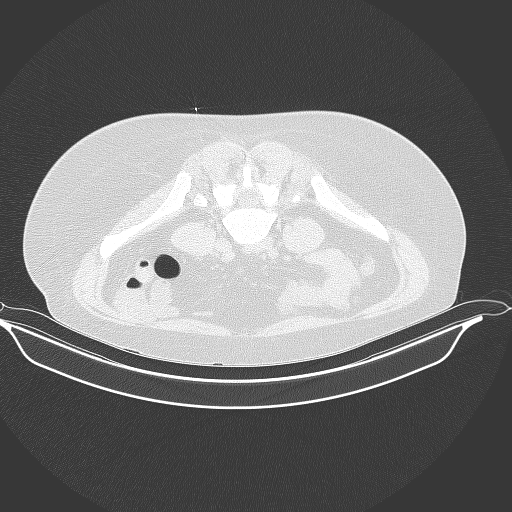

[Series 3: needle -guided injection · axial · 0.87mm/px · z∈[-109,-77]mm · 4 of 28 slices shown (2 of 3)]
[im 6/28  soft-tissue]
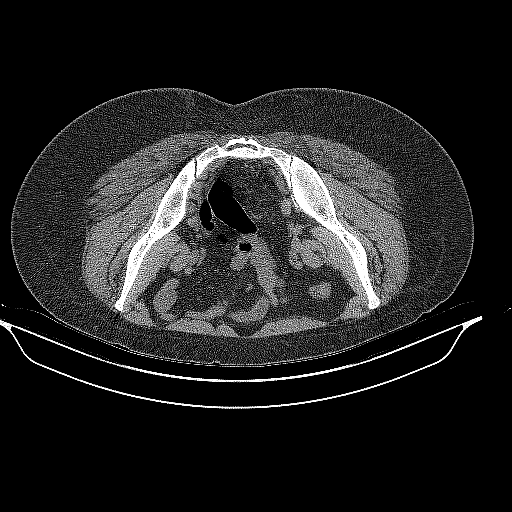
[im 11/28  soft-tissue]
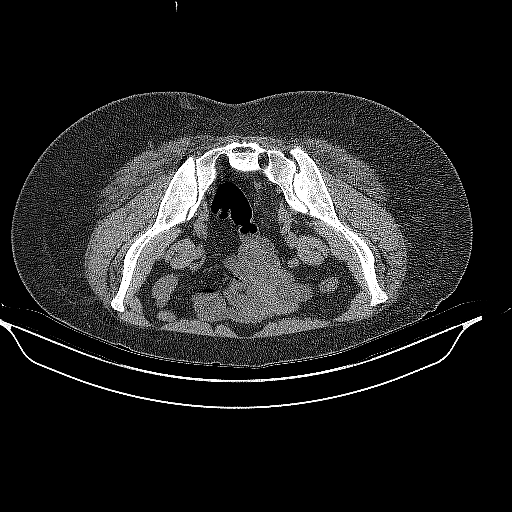
[im 17/28  soft-tissue]
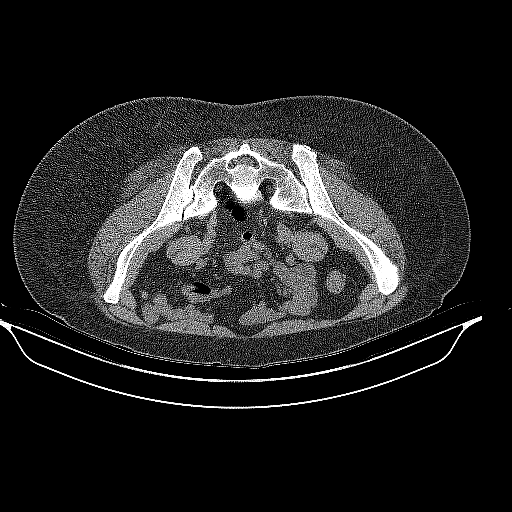
[im 22/28  soft-tissue]
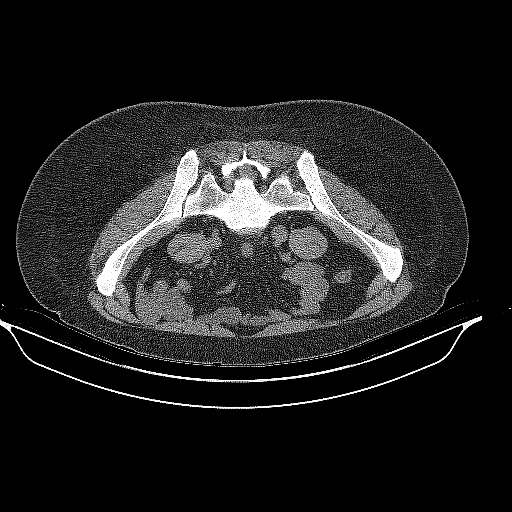

[Series 4: needle -guided injection · axial · 0.87mm/px · z∈[-109,-77]mm · 4 of 28 slices shown (3 of 3)]
[im 6/28  soft-tissue]
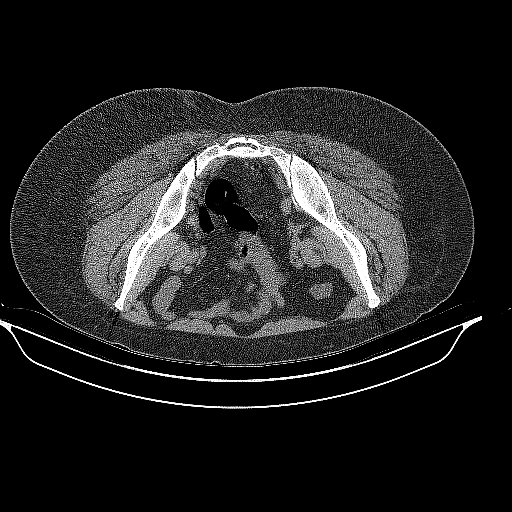
[im 11/28  soft-tissue]
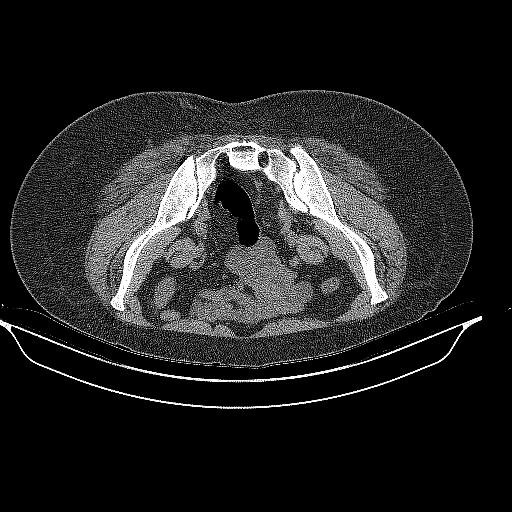
[im 17/28  soft-tissue]
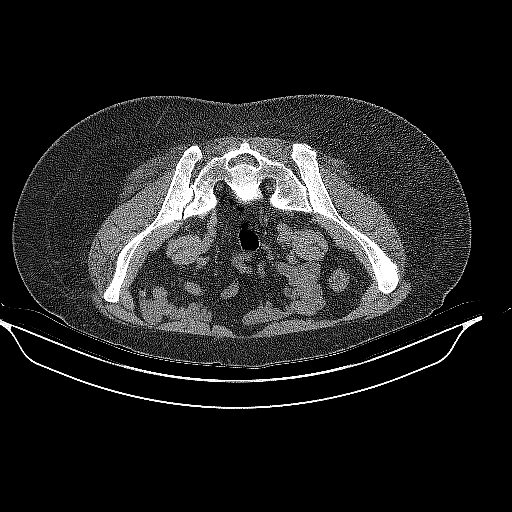
[im 22/28  soft-tissue]
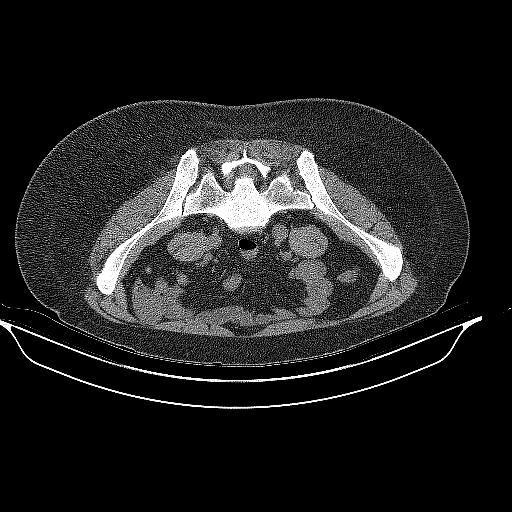

[14 of 32 positions shown; findings below may reference images not displayed]

EXAM:
CT BIOPSY

MEDICATIONS:
None.

ANESTHESIA/SEDATION:
None

FLUOROSCOPY TIME:  None

COMPLICATIONS:
None immediate.

PROCEDURE:
Informed written consent was obtained from the patient after a
thorough discussion of the procedural risks, benefits and
alternatives. All questions were addressed. Maximal Sterile Barrier
Technique was utilized including caps, mask, sterile gowns, sterile
gloves, sterile drape, hand hygiene and skin antiseptic. A timeout
was performed prior to the initiation of the procedure.

1% lidocaine was utilized for local infiltration. Under CT guidance,
a 22 gauge needle was advanced into the inferior right SI joint. 3
cc 0.5% bupivacaine was then injected.
FINDINGS: The images document guide needle placement within the right SI
joint. Post biopsy images demonstrate no hemorrhage.
IMPRESSION: Successful CT-guided right SI joint local anesthetic injection.

## 2018-03-10 ENCOUNTER — Emergency Department (HOSPITAL_COMMUNITY): Admission: EM | Admit: 2018-03-10 | Discharge: 2018-03-10 | Discharge status: Against Medical Advice | Payer: Self-pay

## 2018-03-10 NOTE — ED Notes (Signed)
No answer for triage.

## 2018-03-16 ENCOUNTER — Encounter (HOSPITAL_COMMUNITY): Payer: Self-pay

## 2018-03-16 ENCOUNTER — Emergency Department (HOSPITAL_COMMUNITY)
Admission: EM | Admit: 2018-03-16 | Discharge: 2018-03-16 | Disposition: A | Discharge status: Home / Self Care | Payer: Self-pay | Attending: Emergency Medicine | Admitting: Emergency Medicine

## 2018-03-16 DIAGNOSIS — R634 Abnormal weight loss: Secondary | ICD-10-CM | POA: Insufficient documentation

## 2018-03-16 DIAGNOSIS — Z5321 Procedure and treatment not carried out due to patient leaving prior to being seen by health care provider: Secondary | ICD-10-CM | POA: Insufficient documentation

## 2018-03-16 NOTE — ED Triage Notes (Addendum)
PT reports "pouch"under left breast x 6 months. Pt states it is not painful or red. Pt reports no insurance or pcp and is also concerned about 50lbs weight loss since October 2018

## 2018-12-09 ENCOUNTER — Encounter (HOSPITAL_COMMUNITY): Payer: Self-pay | Admitting: Emergency Medicine

## 2018-12-09 ENCOUNTER — Emergency Department (HOSPITAL_COMMUNITY)
Admission: EM | Admit: 2018-12-09 | Discharge: 2018-12-09 | Disposition: A | Discharge status: Home / Self Care | Payer: Self-pay | Attending: Emergency Medicine | Admitting: Emergency Medicine

## 2018-12-09 ENCOUNTER — Other Ambulatory Visit: Payer: Self-pay

## 2018-12-09 DIAGNOSIS — Z79899 Other long term (current) drug therapy: Secondary | ICD-10-CM | POA: Insufficient documentation

## 2018-12-09 DIAGNOSIS — B9689 Other specified bacterial agents as the cause of diseases classified elsewhere: Secondary | ICD-10-CM | POA: Insufficient documentation

## 2018-12-09 DIAGNOSIS — F17210 Nicotine dependence, cigarettes, uncomplicated: Secondary | ICD-10-CM | POA: Insufficient documentation

## 2018-12-09 DIAGNOSIS — N760 Acute vaginitis: Secondary | ICD-10-CM | POA: Insufficient documentation

## 2018-12-09 LAB — URINALYSIS, ROUTINE W REFLEX MICROSCOPIC
Bilirubin Urine: NEGATIVE
Glucose, UA: NEGATIVE mg/dL
Hgb urine dipstick: NEGATIVE
Ketones, ur: NEGATIVE mg/dL
LEUKOCYTES UA: NEGATIVE
NITRITE: NEGATIVE
PROTEIN: NEGATIVE mg/dL
Specific Gravity, Urine: 1.003 — ABNORMAL LOW (ref 1.005–1.030)
pH: 6 (ref 5.0–8.0)

## 2018-12-09 LAB — PREGNANCY, URINE: PREG TEST UR: NEGATIVE

## 2018-12-09 LAB — WET PREP, GENITAL
Sperm: NONE SEEN
TRICH WET PREP: NONE SEEN
WBC, Wet Prep HPF POC: NONE SEEN
YEAST WET PREP: NONE SEEN

## 2018-12-09 MED ORDER — METRONIDAZOLE 500 MG PO TABS: 500.0000 mg | ORAL_TABLET | Freq: Two times a day (BID) | ORAL | 0 refills | Status: DC

## 2018-12-09 MED ORDER — METRONIDAZOLE 500 MG PO TABS
500.0000 mg | ORAL_TABLET | Freq: Once | ORAL | Status: AC
  Administered 2018-12-09: 500 mg via ORAL
  Filled 2018-12-09: qty 1

## 2018-12-09 NOTE — ED Triage Notes (Signed)
Patient complaining of burning and itching to vaginal area x 1 week. States she has new sexual partner and has had unprotected sex.

## 2018-12-09 NOTE — ED Provider Notes (Signed)
[facility] EMERGENCY DEPARTMENT Provider Note   CSN: [id number] Arrival date & time: 12/09/18  5573     History   Chief Complaint Chief Complaint  Patient presents with  . Vaginal Itching    HPI Teresa [name] is a 42 y.o. female.  HPI   Teresa [name] is a 42 y.o. female who presents to the Emergency Department complaining of vaginal irritation and itching for 1 week.  She states that she has a new sexual partner and has had unprotected sex recently, but she does not think that this is the cause of her symptoms.  She states that she is had BV in the past and feels that her current symptoms are similar.  She is self treated with over-the-counter Monistat for what she believed was a yeast infection.  Her symptoms did not improve.  She states "everything feels raw."  She denies burning with urination, fever, chills, abdominal pain, or excessive vaginal discharge and genital lesions.    Past Medical History:  Diagnosis Date  . Anxiety   . Arthritis    knees   . Asthma due to seasonal allergies   . Bipolar disorder ([place])   . Cancer (HCC)    hx of cervical cancer- laser surgery done   . Carpal tunnel syndrome    bilateral   . GERD (gastroesophageal reflux disease)   . OCD (obsessive compulsive disorder)    2013  . PTSD (post-traumatic stress disorder)    2013  . Shortness of breath dyspnea    allergy related   . Trigger finger    bilateral middle fingers     There are no active problems to display for this patient.   Past Surgical History:  Procedure Laterality Date  . CARDIAC CATHETERIZATION    . CHOLECYSTECTOMY N/A 05/29/2016   Procedure: LAPAROSCOPIC CHOLECYSTECTOMY WITH INTRAOPERATIVE CHOLANGIOGRAM;  Surgeon: Jackolyn Confer, MD;  Location: [facility];  Service: General;  Laterality: N/A;  . LUMBAR LAMINECTOMY Right 08/15/2017   Procedure: RIGHT SIDED LUMBAR FOUR-FIVE  MICRODISCECTOMY.;  Surgeon: Phylliss Bob, MD;  Location: [place];  Service: Orthopedics;   Laterality: Right;  . TUBAL LIGATION    . WISDOM TOOTH EXTRACTION       OB History   None      Home Medications    Prior to Admission medications   Medication Sig Start Date End Date Taking? Authorizing Provider  citalopram (CELEXA) 20 MG tablet Take 20 mg by mouth at bedtime.    [provider]  LORazepam (ATIVAN) 0.5 MG tablet Take 0.5 mg by mouth daily as needed for anxiety.    [provider]  methocarbamol (ROBAXIN) 500 MG tablet Take 1,000 mg by mouth 3 (three) times daily.    [provider]  ondansetron (ZOFRAN) 4 MG tablet Take 1 tablet (4 mg total) by mouth every 4 (four) hours as needed for nausea or vomiting. Patient not taking: Reported on 08/08/2017 05/29/16   Jackolyn Confer, MD  oxyCODONE (OXY IR/ROXICODONE) 5 MG immediate release tablet Take 1-2 tablets (5-10 mg total) by mouth every 4 (four) hours as needed for moderate pain, severe pain or breakthrough pain. Patient not taking: Reported on 08/08/2017 05/29/16   Jackolyn Confer, MD  [facility] HFA [phone] Base) MCG/ACT inhaler Inhale 2 puffs into the lungs every 6 (six) hours as needed for wheezing or shortness of breath.  04/24/16   [provider]  promethazine (PHENERGAN) 25 MG tablet Take 1 tablet (25 mg total) by mouth  every 6 (six) hours as needed for nausea or vomiting. 08/15/17   McKenzie, Lennie Muckle, PA-C  promethazine (PHENERGAN) 25 MG tablet Take 1 tablet (25 mg total) by mouth every 6 (six) hours as needed for nausea or vomiting. 08/15/17   McKenzie, Lennie Muckle, PA-C    Family History History reviewed. No pertinent family history.  Social History Social History   Tobacco Use  . Smoking status: Current Every Day Smoker    Packs/day: 1.50    Years: 24.00    Pack years: 36.00    Types: Cigarettes  . Smokeless tobacco: Never Used  Substance Use Topics  . Alcohol use: Yes    Comment: occasionally  . Drug use: Yes    Types: Marijuana     Allergies   Latex and Vicodin  [hydrocodone-acetaminophen]   Review of Systems Review of Systems  Constitutional: Negative for activity change, appetite change, chills and fever.  Respiratory: Negative for chest tightness and shortness of breath.   Cardiovascular: Negative for chest pain.  Gastrointestinal: Negative for abdominal pain, nausea and vomiting.  Genitourinary: Positive for vaginal pain. Negative for decreased urine volume, difficulty urinating, dysuria, frequency, genital sores, hematuria, urgency and vaginal discharge.  Musculoskeletal: Negative for arthralgias and back pain.  Skin: Negative for rash.  Neurological: Negative for dizziness, weakness and numbness.  Hematological: Negative for adenopathy.  Psychiatric/Behavioral: Negative for confusion.     Physical Exam Updated Vital Signs BP 113/78   Pulse 73   Resp 12   Ht 5\' 2"  (1.575 m)   Wt 62.6 kg   LMP 11/29/2018   SpO2 97%   BMI 25.24 kg/m   Physical Exam  Constitutional: She appears well-developed and well-nourished. No distress.  HENT:  Mouth/Throat: Oropharynx is clear and moist.  Cardiovascular: Normal rate and regular rhythm.  Pulmonary/Chest: Effort normal and breath sounds normal. No respiratory distress.  Abdominal: Soft. She exhibits no distension and no mass. There is no tenderness. There is no guarding.  Genitourinary: Uterus normal. Uterus is not tender. Cervix exhibits no motion tenderness and no friability. Right adnexum displays no mass and no tenderness. Left adnexum displays no mass and no tenderness. No bleeding in the vagina. No foreign body in the vagina. Vaginal discharge found.  Genitourinary Comments: Pelvic exam assisted by nursing staff.  Cervix is firm and no cervical motion tenderness noted.  Small amount of milky vaginal discharge present.  No adnexal masses or tenderness are palpable.  Musculoskeletal: Normal range of motion.  Neurological: She is alert. No sensory deficit.  Skin: Skin is warm. Capillary  refill takes less than 2 seconds.  Psychiatric: She has a normal mood and affect.  Nursing note and vitals reviewed.    ED Treatments / Results  Labs (all labs ordered are listed, but only abnormal results are displayed) Labs Reviewed  URINALYSIS, ROUTINE W REFLEX MICROSCOPIC - Abnormal; Notable for the following components:      Result Value   Color, Urine STRAW (*)    Specific Gravity, Urine 1.003 (*)    All other components within normal limits  WET PREP, GENITAL  PREGNANCY, URINE  GC/CHLAMYDIA PROBE AMP () NOT AT [facility]    EKG None  Radiology No results found.  Procedures Procedures (including critical care time)  Medications Ordered in ED Medications  metroNIDAZOLE (FLAGYL) tablet 500 mg (has no administration in time range)     Initial Impression / Assessment and Plan / ED Course  I have reviewed the triage vital signs and  the nursing notes.  Pertinent labs & imaging results that were available during my care of the patient were reviewed by me and considered in my medical decision making (see chart for details).     Patient well-appearing.  Vitals reviewed.  No fever or abdominal pain on exam.  Wet prep shows clue cells present.  Since patient is having symptoms, I will treat with Flagyl.  Remaining cultures are pending.  Patient agrees to treatment plan and follow-up with the health department if needed.  No concerning symptoms for PID or TOA on exam.  Return precautions discussed.  Final Clinical Impressions(s) / ED Diagnoses   Final diagnoses:  Bacterial vaginosis    ED Discharge Orders    None       Kem Parkinson, PA-C 12/09/18 [id number]    Davonna Belling, MD 12/09/18 1531

## 2018-12-09 NOTE — Discharge Instructions (Addendum)
Take the medication as directed until its finished.  Avoid using harsh soaps or detergent and do not douche.  Follow-up with the health department or with [facility] if needed.  You will be notified if your remaining cultures are positive.

## 2018-12-10 LAB — GC/CHLAMYDIA PROBE AMP (~~LOC~~) NOT AT ARMC
Chlamydia: NEGATIVE
NEISSERIA GONORRHEA: NEGATIVE

## 2019-11-20 ENCOUNTER — Emergency Department
Admission: EM | Admit: 2019-11-20 | Discharge: 2019-11-20 | Disposition: A | Discharge status: Home / Self Care | Payer: Self-pay | Attending: Emergency Medicine | Admitting: Emergency Medicine

## 2019-11-20 ENCOUNTER — Emergency Department: Payer: Self-pay

## 2019-11-20 ENCOUNTER — Encounter: Payer: Self-pay | Admitting: Intensive Care

## 2019-11-20 ENCOUNTER — Other Ambulatory Visit: Payer: Self-pay

## 2019-11-20 DIAGNOSIS — J45909 Unspecified asthma, uncomplicated: Secondary | ICD-10-CM | POA: Insufficient documentation

## 2019-11-20 DIAGNOSIS — Z79899 Other long term (current) drug therapy: Secondary | ICD-10-CM | POA: Insufficient documentation

## 2019-11-20 DIAGNOSIS — F17210 Nicotine dependence, cigarettes, uncomplicated: Secondary | ICD-10-CM | POA: Insufficient documentation

## 2019-11-20 DIAGNOSIS — Z91040 Latex allergy status: Secondary | ICD-10-CM | POA: Insufficient documentation

## 2019-11-20 DIAGNOSIS — R1310 Dysphagia, unspecified: Secondary | ICD-10-CM | POA: Insufficient documentation

## 2019-11-20 LAB — CBC
HCT: 37.7 % (ref 36.0–46.0)
Hemoglobin: 13 g/dL (ref 12.0–15.0)
MCH: 30.7 pg (ref 26.0–34.0)
MCHC: 34.5 g/dL (ref 30.0–36.0)
MCV: 89.1 fL (ref 80.0–100.0)
Platelets: 323 10*3/uL (ref 150–400)
RBC: 4.23 MIL/uL (ref 3.87–5.11)
RDW: 12.7 % (ref 11.5–15.5)
WBC: 7.9 10*3/uL (ref 4.0–10.5)
nRBC: 0 % (ref 0.0–0.2)

## 2019-11-20 LAB — COMPREHENSIVE METABOLIC PANEL
ALT: 17 U/L (ref 0–44)
AST: 14 U/L — ABNORMAL LOW (ref 15–41)
Albumin: 3.9 g/dL (ref 3.5–5.0)
Alkaline Phosphatase: 39 U/L (ref 38–126)
Anion gap: 10 (ref 5–15)
BUN: 12 mg/dL (ref 6–20)
CO2: 22 mmol/L (ref 22–32)
Calcium: 8.7 mg/dL — ABNORMAL LOW (ref 8.9–10.3)
Chloride: 105 mmol/L (ref 98–111)
Creatinine, Ser: 0.59 mg/dL (ref 0.44–1.00)
GFR calc Af Amer: >60 mL/min (ref >60)
GFR calc non Af Amer: >60 mL/min (ref >60)
Glucose, Bld: 90 mg/dL (ref 70–99)
Potassium: 3.7 mmol/L (ref 3.5–5.1)
Sodium: 137 mmol/L (ref 135–145)
Total Bilirubin: 0.6 mg/dL (ref 0.3–1.2)
Total Protein: 6.8 g/dL (ref 6.5–8.1)

## 2019-11-20 IMAGING — CT CT NECK W/ CM
4 series · 14 of 33 positions shown, 17 images · IV contrast (omnipaque)
Comparison: None.

CLINICAL DATA: Dysphagia following an episode of emesis on
[DATE]. No pain.

EXAM:
CT NECK WITH CONTRAST
TECHNIQUE: Multidetector CT imaging of the neck was performed using the
standard protocol following the bolus administration of intravenous
contrast.
CONTRAST:  75mL OMNIPAQUE IOHEXOL 300 MG/ML  SOLN

[Series 2: axial neck · axial · 0.54mm/px · z∈[-236,-84]mm · 5 of 115 slices shown, 7 images]
[im 20/115  soft-tissue]
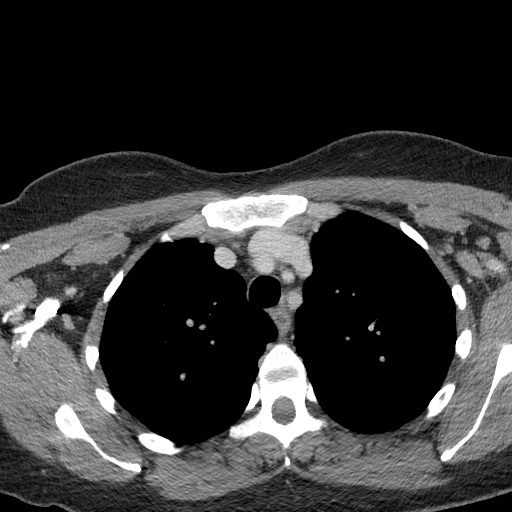
[im 20/115  bone]
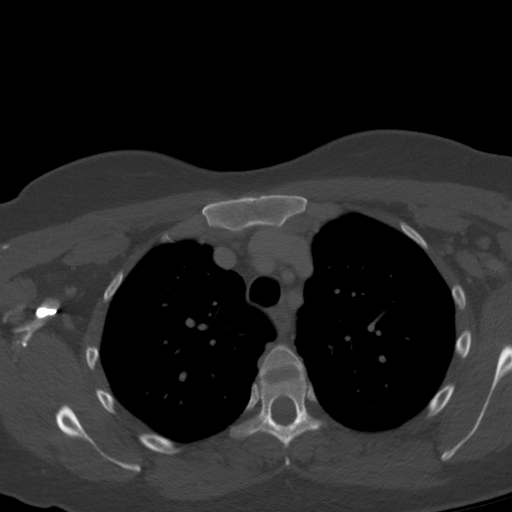
[im 39/115  bone]
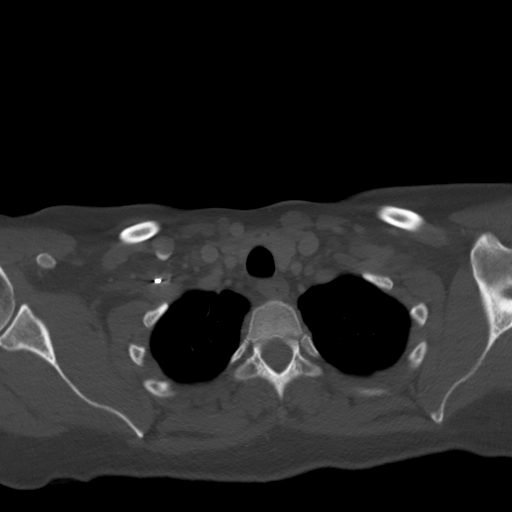
[im 58/115  bone]
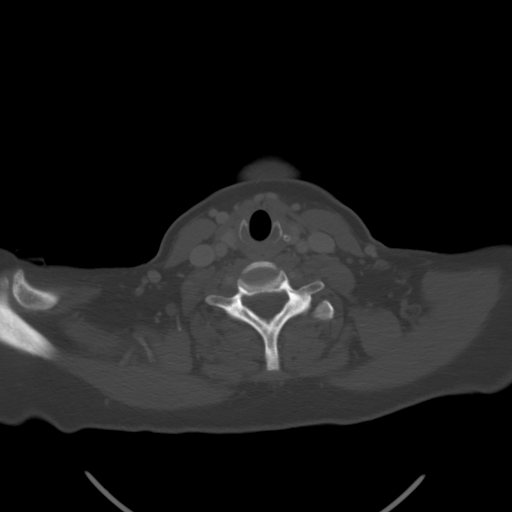
[im 77/115  bone]
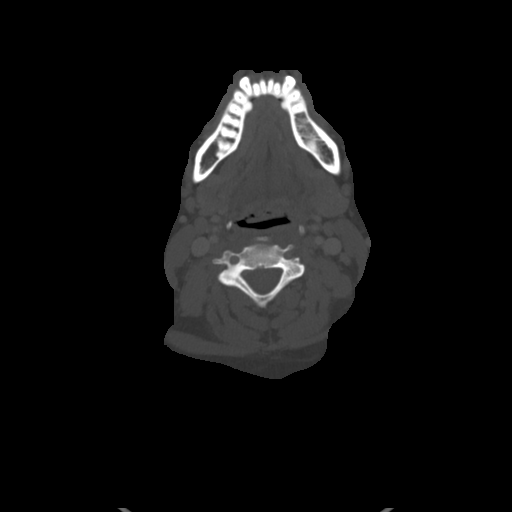
[im 96/115  soft-tissue]
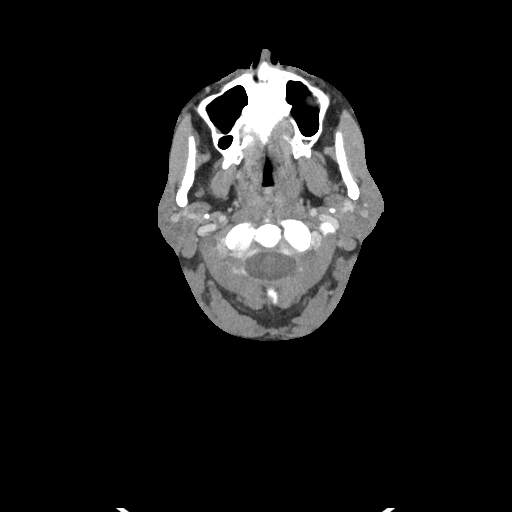
[im 96/115  bone]
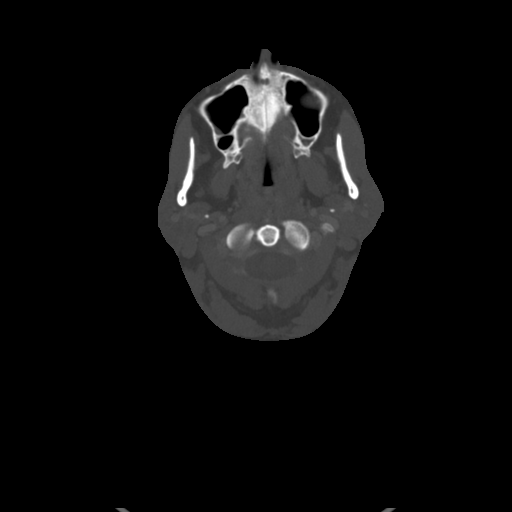

[Series 5: sag neck · sagittal · 0.38mm/px · 5 of 69 slices shown, 6 images]
[im 23/69  bone]
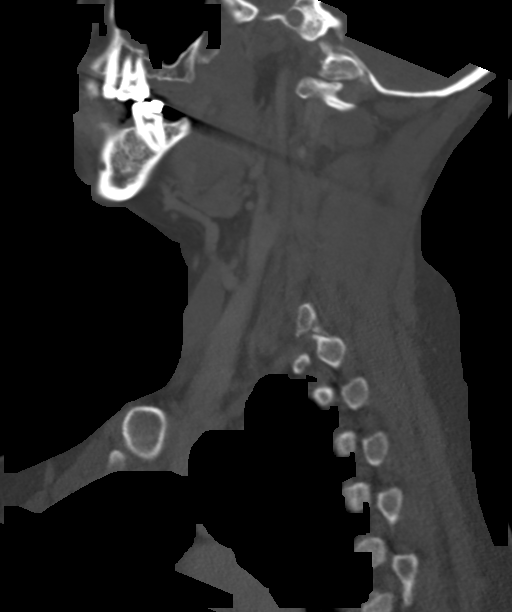
[im 29/69  bone]
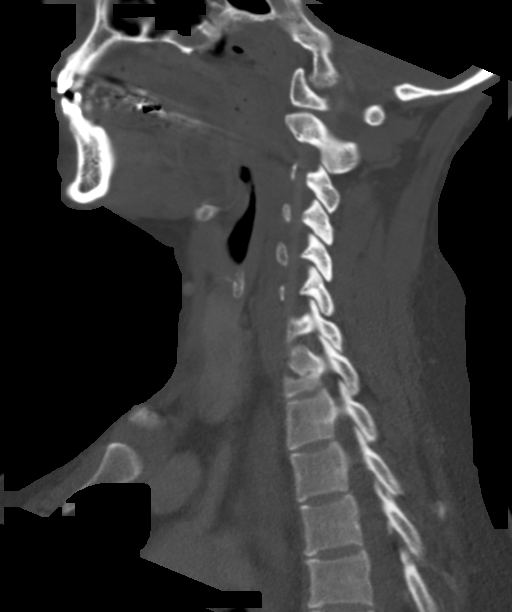
[im 35/69  soft-tissue]
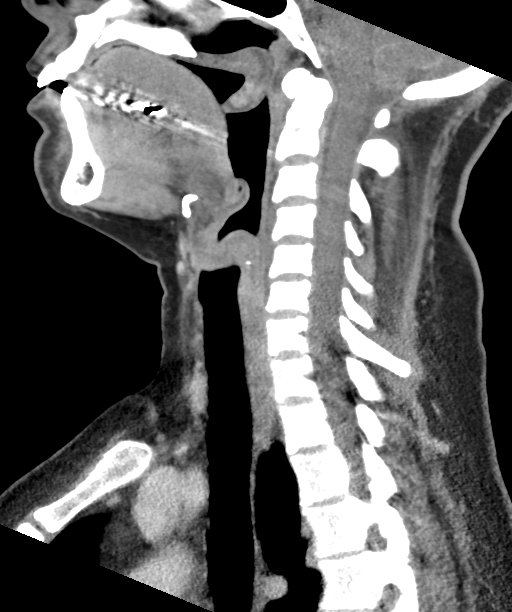
[im 35/69  bone]
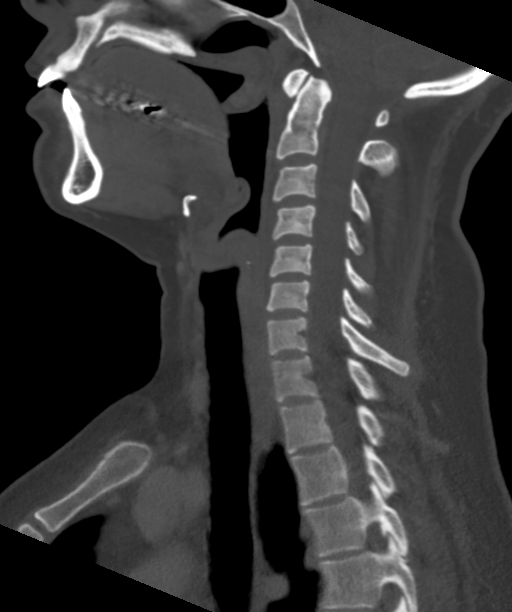
[im 40/69  bone]
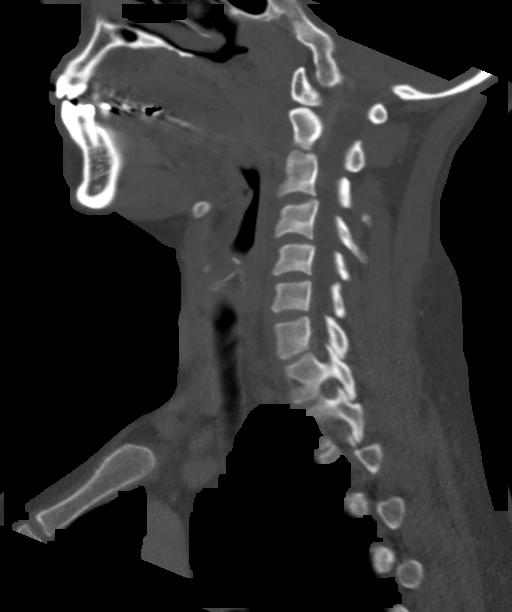
[im 46/69  bone]
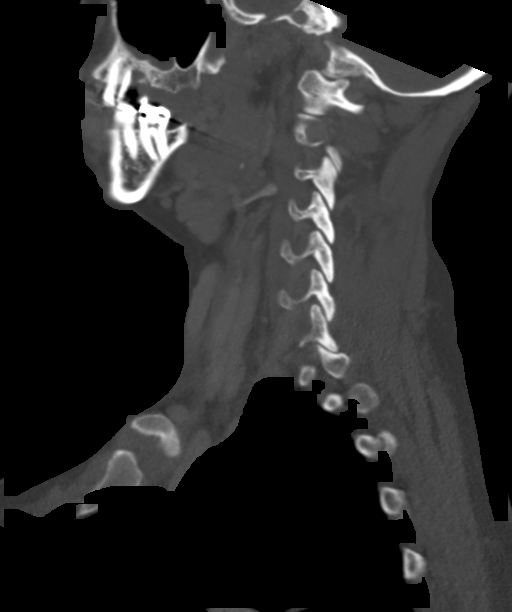

[Series 6: cor neck · coronal · 0.35mm/px · 3 of 95 slices shown]
[im 25/95  bone]
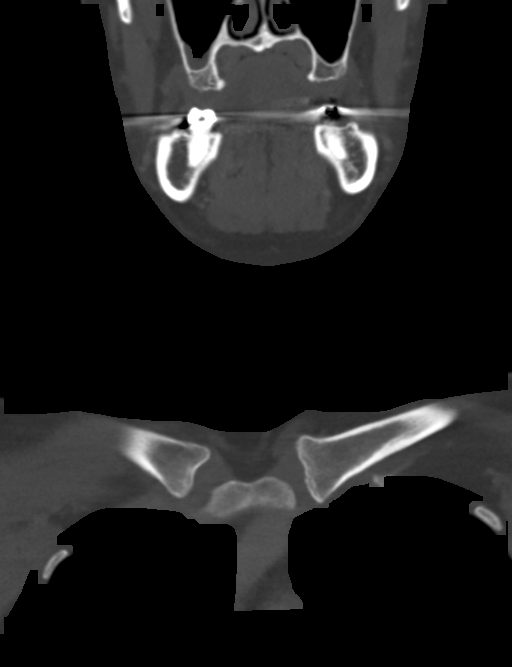
[im 40/95  bone]
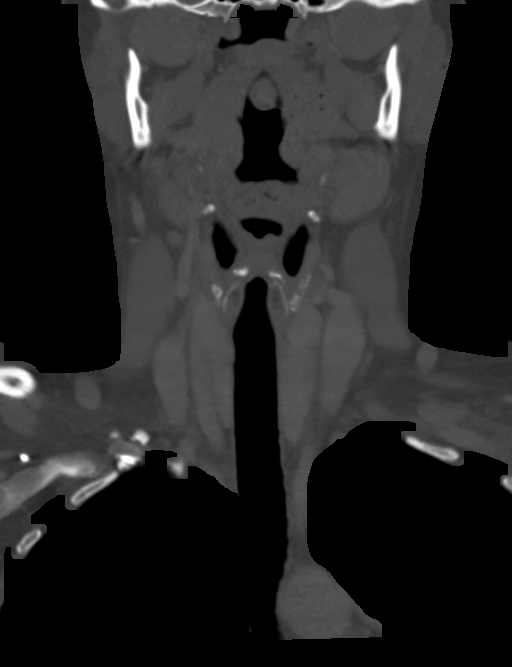
[im 55/95  bone]
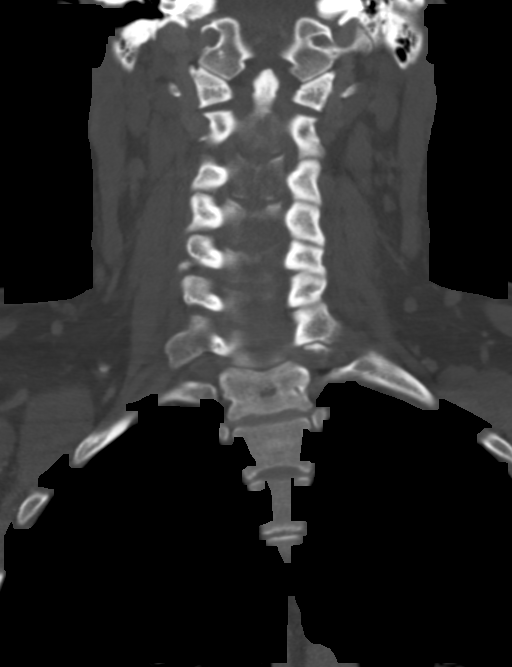

[Series 7: orthogonal ax · axial · 0.35mm/px · 1 of 115 slices shown]
[im 20/115  bone]
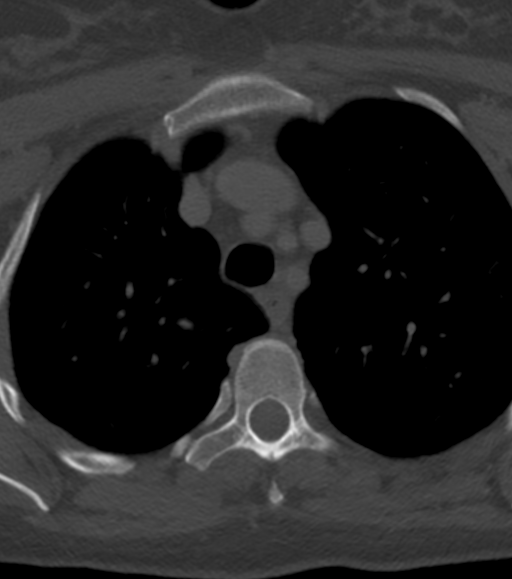

[14 of 33 positions shown; findings below may reference images not displayed]

FINDINGS: Pharynx and larynx: No evidence of mass or significant swelling.
Mild symmetric prominence of the palatine tonsils. Patent airway. No
fluid collection or inflammatory changes in the parapharyngeal or
retropharyngeal spaces.

Salivary glands: No inflammation, mass, or stone.

Thyroid: Unremarkable.

Lymph nodes: No enlarged or suspicious lymph nodes in the neck.

Vascular: Major vascular structures of the neck are patent.

Limited intracranial: Unremarkable.

Visualized orbits: Unremarkable.

Mastoids and visualized paranasal sinuses: Minimal bilateral
maxillary sinus mucosal thickening. Clear mastoid air cells.

Skeleton: No acute osseous abnormality or suspicious osseous lesion.

Upper chest: Clear lung apices.

Other: None.
IMPRESSION: No acute abnormality or cause of dysphagia identified.

## 2019-11-20 MED ORDER — LORAZEPAM 2 MG/ML IJ SOLN
0.5000 mg | Freq: Once | INTRAMUSCULAR | Status: AC
  Administered 2019-11-20: 0.5 mg via INTRAVENOUS
  Filled 2019-11-20: qty 1

## 2019-11-20 MED ORDER — IOHEXOL 300 MG/ML  SOLN
75.0000 mL | Freq: Once | INTRAMUSCULAR | Status: AC | PRN
  Administered 2019-11-20: 75 mL via INTRAVENOUS

## 2019-11-20 MED ORDER — ALUM & MAG HYDROXIDE-SIMETH 200-200-20 MG/5ML PO SUSP
30.0000 mL | Freq: Once | ORAL | Status: AC
  Administered 2019-11-20: 30 mL via ORAL
  Filled 2019-11-20: qty 30

## 2019-11-20 MED ORDER — SODIUM CHLORIDE 0.9 % IV SOLN
1000.0000 mL | Freq: Once | INTRAVENOUS | Status: AC
  Administered 2019-11-20: 1000 mL via INTRAVENOUS

## 2019-11-20 MED ORDER — LIDOCAINE VISCOUS HCL 2 % MT SOLN: 15.0000 mL | Freq: Four times a day (QID) | OROMUCOSAL | 0 refills | Status: DC | PRN

## 2019-11-20 MED ORDER — PANTOPRAZOLE SODIUM 40 MG PO TBEC: 40.0000 mg | DELAYED_RELEASE_TABLET | Freq: Every day | ORAL | 1 refills | Status: DC

## 2019-11-20 MED ORDER — LIDOCAINE VISCOUS HCL 2 % MT SOLN: 15.0000 mL | Freq: Four times a day (QID) | OROMUCOSAL | 0 refills | Status: AC | PRN

## 2019-11-20 MED ORDER — LIDOCAINE VISCOUS HCL 2 % MT SOLN
15.0000 mL | Freq: Once | OROMUCOSAL | Status: AC
  Administered 2019-11-20: 15 mL via ORAL
  Filled 2019-11-20: qty 15

## 2019-11-20 NOTE — ED Triage Notes (Addendum)
Pt c/o swallowing difficulties ever since having an episode of emesis early Sunday 11/15/19 morning 0400 after drinking Saturday night. Denies pain to throat and denies swelling. Reports to hard to even swallow spit. Feels like a rock is stuck in her throat. Unable to eat normally. Speech clear and no difficulties speaking. NAD noted. Unlabored breathing in triage

## 2019-11-20 NOTE — ED Provider Notes (Signed)
[facility] Emergency Department Provider Note   ____________________________________________    I have reviewed the triage vital signs and the nursing notes.   HISTORY  Chief Complaint Dysphagia     HPI Teresa [name] is a 43 y.o. female who presents with difficulty swallowing.  Patient reports Saturday night she drank significant alcohol, Sunday morning she was vomiting significantly.  Shortly thereafter she developed a sensation of tightness in her lower esophagus.  She denies difficulty breathing.  She reports she is only able to tolerate finely chewed food in small quantities that is very soft.  She can take sips of water.  Otherwise she feels as though liquid is going down as though through a final.  No history of similar symptoms in the past.  Past Medical History:  Diagnosis Date  . Anxiety   . Arthritis    knees   . Asthma due to seasonal allergies   . Bipolar disorder ([place])   . Cancer (HCC)    hx of cervical cancer- laser surgery done   . Carpal tunnel syndrome    bilateral   . GERD (gastroesophageal reflux disease)   . OCD (obsessive compulsive disorder)    2013  . PTSD (post-traumatic stress disorder)    2013  . Shortness of breath dyspnea    allergy related   . Trigger finger    bilateral middle fingers     There are no active problems to display for this patient.   Past Surgical History:  Procedure Laterality Date  . CARDIAC CATHETERIZATION    . CHOLECYSTECTOMY N/A 05/29/2016   Procedure: LAPAROSCOPIC CHOLECYSTECTOMY WITH INTRAOPERATIVE CHOLANGIOGRAM;  Surgeon: Jackolyn Confer, MD;  Location: [facility];  Service: General;  Laterality: N/A;  . LUMBAR LAMINECTOMY Right 08/15/2017   Procedure: RIGHT SIDED LUMBAR FOUR-FIVE  MICRODISCECTOMY.;  Surgeon: Phylliss Bob, MD;  Location: [place];  Service: Orthopedics;  Laterality: Right;  . TUBAL LIGATION    . WISDOM TOOTH EXTRACTION      Prior to Admission medications   Medication  Sig Start Date End Date Taking? Authorizing Provider  citalopram (CELEXA) 20 MG tablet Take 20 mg by mouth at bedtime.    [provider]  LORazepam (ATIVAN) 0.5 MG tablet Take 0.5 mg by mouth daily as needed for anxiety.    [provider]  methocarbamol (ROBAXIN) 500 MG tablet Take 1,000 mg by mouth 3 (three) times daily.    [provider]  metroNIDAZOLE (FLAGYL) 500 MG tablet Take 1 tablet (500 mg total) by mouth 2 (two) times daily. For 7 days 12/09/18   Triplett, Tammy, PA-C  ondansetron (ZOFRAN) 4 MG tablet Take 1 tablet (4 mg total) by mouth every 4 (four) hours as needed for nausea or vomiting. Patient not taking: Reported on 08/08/2017 05/29/16   Jackolyn Confer, MD  oxyCODONE (OXY IR/ROXICODONE) 5 MG immediate release tablet Take 1-2 tablets (5-10 mg total) by mouth every 4 (four) hours as needed for moderate pain, severe pain or breakthrough pain. Patient not taking: Reported on 08/08/2017 05/29/16   Jackolyn Confer, MD  [facility] HFA [phone] Base) MCG/ACT inhaler Inhale 2 puffs into the lungs every 6 (six) hours as needed for wheezing or shortness of breath.  04/24/16   [provider]  promethazine (PHENERGAN) 25 MG tablet Take 1 tablet (25 mg total) by mouth every 6 (six) hours as needed for nausea or vomiting. 08/15/17   McKenzie, Lennie Muckle, PA-C  promethazine (PHENERGAN) 25 MG tablet Take  1 tablet (25 mg total) by mouth every 6 (six) hours as needed for nausea or vomiting. 08/15/17   McKenzie, Lennie Muckle, PA-C     Allergies Latex and Vicodin [hydrocodone-acetaminophen]  History reviewed. No pertinent family history.  Social History Social History   Tobacco Use  . Smoking status: Current Every Day Smoker    Packs/day: 1.50    Years: 24.00    Pack years: 36.00    Types: Cigarettes  . Smokeless tobacco: Never Used  Substance Use Topics  . Alcohol use: Yes  . Drug use: Yes    Types: Marijuana    Review of Systems  Constitutional: No fever/chills  Eyes: No visual changes.  ENT: As above Cardiovascular: Denies chest pain. Respiratory: Denies shortness of breath. Gastrointestinal: No abdominal pain.   Genitourinary: Negative for dysuria. Musculoskeletal: Negative for back pain. Skin: Negative for rash. Neurological: Negative for headaches    ____________________________________________   PHYSICAL EXAM:  VITAL SIGNS: ED Triage Vitals  Enc Vitals Group     BP 11/20/19 1245 124/76     Pulse Rate 11/20/19 1245 72     Resp 11/20/19 1245 16     Temp 11/20/19 1245 98.7 F (37.1 C)     Temp Source 11/20/19 1245 Oral     SpO2 11/20/19 1245 96 %     Weight 11/20/19 1241 63.5 kg (140 lb)     Height 11/20/19 1241 1.549 m (5\' 1" )     Head Circumference --      Peak Flow --      Pain Score 11/20/19 1241 0     Pain Loc --      Pain Edu? --      Excl. in [place]? --     Constitutional: Alert and oriented.   Nose: No congestion/rhinnorhea. Mouth/Throat: Mucous membranes are moist.  Pharynx normal Neck:  Painless ROM, no thyroid abnormality Cardiovascular: Normal rate, regular rhythm Good peripheral circulation. Respiratory: Normal respiratory effort.  No retractions. Gastrointestinal: Soft and nontender. No distention.    Musculoskeletal: Warm and well perfused Neurologic:  Normal speech and language. No gross focal neurologic deficits are appreciated.  Skin:  Skin is warm, dry and intact. No rash noted. Psychiatric: Mood and affect are normal. Speech and behavior are normal.  ____________________________________________   LABS (all labs ordered are listed, but only abnormal results are displayed)  Labs Reviewed  COMPREHENSIVE METABOLIC PANEL - Abnormal; Notable for the following components:      Result Value   Calcium 8.7 (*)    AST 14 (*)    All other components within normal limits  CBC   ____________________________________________  EKG  None ____________________________________________  RADIOLOGY  CT soft  tissue neck ____________________________________________   PROCEDURES  Procedure(s) performed: No  Procedures   Critical Care performed: No ____________________________________________   INITIAL IMPRESSION / ASSESSMENT AND PLAN / ED COURSE  Pertinent labs & imaging results that were available during my care of the patient were reviewed by me and considered in my medical decision making (see chart for details).  Patient presents with difficulty swallowing as noted above. Given recent vomiting palpated for crepitus although no significant abnormalities. Will obtain imaging of the neck to rule out structural/compression abnormalities. May require GI consultation    ____________________________________________   FINAL CLINICAL IMPRESSION(S) / ED DIAGNOSES  Final diagnoses:  Dysphagia, unspecified type        Note:  This document was prepared using Dragon voice recognition software and may include unintentional dictation errors.  Lavonia Drafts, MD 11/20/19 [phone]

## 2019-11-20 NOTE — ED Notes (Signed)
Showed pt family member how to change tv station

## 2019-12-09 ENCOUNTER — Other Ambulatory Visit: Payer: Self-pay

## 2019-12-09 ENCOUNTER — Encounter (HOSPITAL_COMMUNITY): Payer: Self-pay | Admitting: *Deleted

## 2019-12-09 ENCOUNTER — Emergency Department (HOSPITAL_COMMUNITY)
Admission: EM | Admit: 2019-12-09 | Discharge: 2019-12-09 | Disposition: A | Discharge status: Home / Self Care | Payer: Self-pay | Attending: Emergency Medicine | Admitting: Emergency Medicine

## 2019-12-09 DIAGNOSIS — K429 Umbilical hernia without obstruction or gangrene: Secondary | ICD-10-CM | POA: Insufficient documentation

## 2019-12-09 DIAGNOSIS — Z79899 Other long term (current) drug therapy: Secondary | ICD-10-CM | POA: Insufficient documentation

## 2019-12-09 DIAGNOSIS — F17210 Nicotine dependence, cigarettes, uncomplicated: Secondary | ICD-10-CM | POA: Insufficient documentation

## 2019-12-09 DIAGNOSIS — J45909 Unspecified asthma, uncomplicated: Secondary | ICD-10-CM | POA: Insufficient documentation

## 2019-12-09 MED ORDER — MUPIROCIN CALCIUM 2 % EX CREA: TOPICAL_CREAM | CUTANEOUS | 0 refills | Status: DC

## 2019-12-09 NOTE — ED Provider Notes (Signed)
[facility] EMERGENCY DEPARTMENT Provider Note   CSN: [id number] Arrival date & time: 12/09/19  1114     History   Chief Complaint Chief Complaint  Patient presents with  . Abdominal Pain    HPI Teresa HALFMANN is a 43 y.o. female.  Patient is a 43y/o female with PMH of umbilical hernia, anxiety, and bipolar disorder with PTSD presenting for abdominal pain for about one week. It first started when her belly button began hurting and then she noticed it turned red. It became very tender and she noticed some discharge. She has been cleaning it with peroxide and using a OTC triple antibiotic ointment. She has had no more discharge, no fevers, chills, redness or swelling around the umbilicus. She has been able to have regular bowel movements without issue. She does endorse some nausea but no vomiting.  Past Medical History:  Diagnosis Date  . Anxiety   . Arthritis    knees   . Asthma due to seasonal allergies   . Bipolar disorder ([place])   . Cancer (HCC)    hx of cervical cancer- laser surgery done   . Carpal tunnel syndrome    bilateral   . GERD (gastroesophageal reflux disease)   . OCD (obsessive compulsive disorder)    2013  . PTSD (post-traumatic stress disorder)    2013  . Shortness of breath dyspnea    allergy related   . Trigger finger    bilateral middle fingers     There are no active problems to display for this patient.   Past Surgical History:  Procedure Laterality Date  . CARDIAC CATHETERIZATION    . CHOLECYSTECTOMY N/A 05/29/2016   Procedure: LAPAROSCOPIC CHOLECYSTECTOMY WITH INTRAOPERATIVE CHOLANGIOGRAM;  Surgeon: Jackolyn Confer, MD;  Location: [facility];  Service: General;  Laterality: N/A;  . LUMBAR LAMINECTOMY Right 08/15/2017   Procedure: RIGHT SIDED LUMBAR FOUR-FIVE  MICRODISCECTOMY.;  Surgeon: Phylliss Bob, MD;  Location: [place];  Service: Orthopedics;  Laterality: Right;  . TUBAL LIGATION    . WISDOM TOOTH EXTRACTION       OB History   No obstetric  history on file.      Home Medications    Prior to Admission medications   Medication Sig Start Date End Date Taking? Authorizing Provider  citalopram (CELEXA) 20 MG tablet Take 20 mg by mouth at bedtime.    [provider]  LORazepam (ATIVAN) 0.5 MG tablet Take 0.5 mg by mouth daily as needed for anxiety.    [provider]  methocarbamol (ROBAXIN) 500 MG tablet Take 1,000 mg by mouth 3 (three) times daily.    [provider]  metroNIDAZOLE (FLAGYL) 500 MG tablet Take 1 tablet (500 mg total) by mouth 2 (two) times daily. For 7 days 12/09/18   Triplett, Tammy, PA-C  ondansetron (ZOFRAN) 4 MG tablet Take 1 tablet (4 mg total) by mouth every 4 (four) hours as needed for nausea or vomiting. Patient not taking: Reported on 08/08/2017 05/29/16   Jackolyn Confer, MD  oxyCODONE (OXY IR/ROXICODONE) 5 MG immediate release tablet Take 1-2 tablets (5-10 mg total) by mouth every 4 (four) hours as needed for moderate pain, severe pain or breakthrough pain. Patient not taking: Reported on 08/08/2017 05/29/16   Jackolyn Confer, MD  pantoprazole (PROTONIX) 40 MG tablet Take 1 tablet (40 mg total) by mouth daily. 11/20/19 11/19/20  Earleen Newport, MD  PROAIR HFA 108 [phone] Base) MCG/ACT inhaler Inhale 2 puffs into the lungs every 6 (six) hours  as needed for wheezing or shortness of breath.  04/24/16   [provider]  promethazine (PHENERGAN) 25 MG tablet Take 1 tablet (25 mg total) by mouth every 6 (six) hours as needed for nausea or vomiting. 08/15/17   McKenzie, Lennie Muckle, PA-C  promethazine (PHENERGAN) 25 MG tablet Take 1 tablet (25 mg total) by mouth every 6 (six) hours as needed for nausea or vomiting. 08/15/17   McKenzie, Lennie Muckle, PA-C    Family History No family history on file.  Social History Social History   Tobacco Use  . Smoking status: Current Every Day Smoker    Packs/day: 1.00    Years: 24.00    Pack years: 24.00    Types: Cigarettes  . Smokeless  tobacco: Never Used  Substance Use Topics  . Alcohol use: Yes  . Drug use: Yes    Types: Marijuana     Allergies   Latex and Vicodin [hydrocodone-acetaminophen]   Review of Systems Review of Systems  Constitutional: Negative for activity change, chills, fatigue and fever.  HENT: Negative for congestion, rhinorrhea, sinus pain, sneezing and sore throat.   Respiratory: Negative for cough, chest tightness, shortness of breath and wheezing.   Cardiovascular: Negative for chest pain.  Gastrointestinal: Positive for abdominal pain, diarrhea and nausea. Negative for constipation and vomiting.  Genitourinary: Negative for difficulty urinating, dysuria, frequency and urgency.  Musculoskeletal: Negative for arthralgias.  Skin: Negative for rash and wound.    Physical Exam Updated Vital Signs BP 125/85 (BP Location: Right Arm)   Pulse 77   Temp 97.9 F (36.6 C) (Oral)   Resp 18   Ht 5\' 1"  (1.549 m)   Wt 63.5 kg   LMP 12/02/2019   SpO2 99%   BMI 26.45 kg/m   Physical Exam Constitutional:      General: She is not in acute distress.    Appearance: She is not ill-appearing.  HENT:     Head: Normocephalic and atraumatic.  Eyes:     Extraocular Movements: Extraocular movements intact.     Pupils: Pupils are equal, round, and reactive to light.  Cardiovascular:     Rate and Rhythm: Normal rate and regular rhythm.     Heart sounds: Normal heart sounds. No murmur.  Pulmonary:     Effort: Pulmonary effort is normal. No respiratory distress.     Breath sounds: No wheezing, rhonchi or rales.  Abdominal:     General: Abdomen is flat. Bowel sounds are normal. There is no distension. There are no signs of injury.     Palpations: Abdomen is soft. There is no mass.     Tenderness: There is abdominal tenderness in the periumbilical area. There is no guarding or rebound. Negative signs include Murphy's sign and McBurney's sign.     Comments: Reducible umbilical hernia that is erythematous  without discharge.  Skin:    General: Skin is warm and dry.     Capillary Refill: Capillary refill takes less than 2 seconds.     Findings: No erythema or rash.  Neurological:     Mental Status: She is alert.    ED Treatments / Results  Labs (all labs ordered are listed, but only abnormal results are displayed) Labs Reviewed - No data to display  EKG None  Radiology No results found.  Procedures Procedures (including critical care time)  Medications Ordered in ED Medications - No data to display   Initial Impression / Assessment and Plan / ED Course  I have  reviewed the triage vital signs and the nursing notes.  Pertinent labs & imaging results that were available during my care of the patient were reviewed by me and considered in my medical decision making (see chart for details).  Patient is a 43y/o female who presents today for umbilical pain secondary to a umbilical hernia. Most likely her pain is due to umbilical herniation but reassured since it is reducible, patient is passing gas, and having normal stools. No signs of worsening cellulitis requiring oral antibiotics. Instructed to continue keeping area clean and using OTC triple antibiotic ointment and to seek follow up care for red flag symptoms such as inability to have a bowel movement or pass gas, fever, chills, or vomiting.  Final Clinical Impressions(s) / ED Diagnoses   Final diagnoses:  Umbilical hernia without obstruction and without gangrene    ED Discharge Orders    None       Nuala Alpha, DO 12/09/19 1238    Elnora Morrison, MD 12/09/19 1557

## 2019-12-09 NOTE — ED Triage Notes (Addendum)
Pt c/o redness, swelling, pus drainage from belly button that is worsening over the last week. Pt reports she has an umbilical hernia. Pt also c/o nausea, but no vomiting.

## 2020-06-08 ENCOUNTER — Encounter (HOSPITAL_COMMUNITY): Payer: Self-pay | Admitting: Emergency Medicine

## 2020-06-08 ENCOUNTER — Emergency Department (HOSPITAL_COMMUNITY)
Admission: EM | Admit: 2020-06-08 | Discharge: 2020-06-08 | Disposition: A | Discharge status: Home / Self Care | Payer: Self-pay | Attending: Emergency Medicine | Admitting: Emergency Medicine

## 2020-06-08 ENCOUNTER — Other Ambulatory Visit: Payer: Self-pay

## 2020-06-08 DIAGNOSIS — R1310 Dysphagia, unspecified: Secondary | ICD-10-CM | POA: Insufficient documentation

## 2020-06-08 DIAGNOSIS — Z5321 Procedure and treatment not carried out due to patient leaving prior to being seen by health care provider: Secondary | ICD-10-CM | POA: Insufficient documentation

## 2020-06-08 NOTE — ED Triage Notes (Signed)
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

## 2020-06-16 ENCOUNTER — Other Ambulatory Visit: Payer: Self-pay

## 2020-06-16 ENCOUNTER — Encounter (HOSPITAL_COMMUNITY): Payer: Self-pay | Admitting: *Deleted

## 2020-06-16 ENCOUNTER — Emergency Department (HOSPITAL_COMMUNITY)
Admission: EM | Admit: 2020-06-16 | Discharge: 2020-06-16 | Disposition: A | Discharge status: Home / Self Care | Payer: Self-pay | Attending: Emergency Medicine | Admitting: Emergency Medicine

## 2020-06-16 DIAGNOSIS — N939 Abnormal uterine and vaginal bleeding, unspecified: Secondary | ICD-10-CM | POA: Insufficient documentation

## 2020-06-16 DIAGNOSIS — Z885 Allergy status to narcotic agent status: Secondary | ICD-10-CM | POA: Insufficient documentation

## 2020-06-16 DIAGNOSIS — Z91040 Latex allergy status: Secondary | ICD-10-CM | POA: Insufficient documentation

## 2020-06-16 DIAGNOSIS — J45909 Unspecified asthma, uncomplicated: Secondary | ICD-10-CM | POA: Insufficient documentation

## 2020-06-16 DIAGNOSIS — F17210 Nicotine dependence, cigarettes, uncomplicated: Secondary | ICD-10-CM | POA: Insufficient documentation

## 2020-06-16 LAB — CBC WITH DIFFERENTIAL/PLATELET
Abs Immature Granulocytes: 0.01 10*3/uL (ref 0.00–0.07)
Basophils Absolute: 0 10*3/uL (ref 0.0–0.1)
Basophils Relative: 1 %
Eosinophils Absolute: 0.1 10*3/uL (ref 0.0–0.5)
Eosinophils Relative: 1 %
HCT: 39.1 % (ref 36.0–46.0)
Hemoglobin: 13 g/dL (ref 12.0–15.0)
Immature Granulocytes: 0 %
Lymphocytes Relative: 33 %
Lymphs Abs: 2.5 10*3/uL (ref 0.7–4.0)
MCH: 30.9 pg (ref 26.0–34.0)
MCHC: 33.2 g/dL (ref 30.0–36.0)
MCV: 92.9 fL (ref 80.0–100.0)
Monocytes Absolute: 0.5 10*3/uL (ref 0.1–1.0)
Monocytes Relative: 7 %
Neutro Abs: 4.5 10*3/uL (ref 1.7–7.7)
Neutrophils Relative %: 58 %
Platelets: 370 10*3/uL (ref 150–400)
RBC: 4.21 MIL/uL (ref 3.87–5.11)
RDW: 12.7 % (ref 11.5–15.5)
WBC: 7.7 10*3/uL (ref 4.0–10.5)
nRBC: 0 % (ref 0.0–0.2)

## 2020-06-16 LAB — BASIC METABOLIC PANEL
Anion gap: 9 (ref 5–15)
BUN: 11 mg/dL (ref 6–20)
CO2: 24 mmol/L (ref 22–32)
Calcium: 9 mg/dL (ref 8.9–10.3)
Chloride: 104 mmol/L (ref 98–111)
Creatinine, Ser: 0.57 mg/dL (ref 0.44–1.00)
GFR calc Af Amer: >60 mL/min (ref >60)
GFR calc non Af Amer: >60 mL/min (ref >60)
Glucose, Bld: 92 mg/dL (ref 70–99)
Potassium: 3.5 mmol/L (ref 3.5–5.1)
Sodium: 137 mmol/L (ref 135–145)

## 2020-06-16 LAB — HCG, QUANTITATIVE, PREGNANCY: hCG, Beta Chain, Quant, S: <1 m[IU]/mL (ref <5)

## 2020-06-16 MED ORDER — NAPROXEN 500 MG PO TABS: 500.0000 mg | ORAL_TABLET | Freq: Two times a day (BID) | ORAL | 0 refills | Status: DC

## 2020-06-16 MED ORDER — MEDROXYPROGESTERONE ACETATE 10 MG PO TABS: 10.0000 mg | ORAL_TABLET | Freq: Every day | ORAL | 0 refills | Status: DC

## 2020-06-16 NOTE — ED Provider Notes (Signed)
[facility] Provider Note   CSN: [id number] Arrival date & time: 06/16/20  1720     History Chief Complaint  Patient presents with  . Vaginal Bleeding    Teresa [name] is a 44 y.o. female with no relevant past medical history presents the ED with complaints of heavy menses.  Patient reports that she had a tubal ligation years ago and so she has not been particular concerned with her menses, but simply remembers having her last period around 05/09/2020.  She states that it was normal for her.  However, she began her cycle yesterday and states that she has had profuse bleeding.  She has been changing her pads rapidly and has had to throw away her underwear.  She states that she is allergic to tampons.  She has also been experiencing her typical menses cramping, however notes that has been a little bit worse than usual.  She was concerned that perhaps she may be pregnant despite her tubal ligation.  She denies any dizziness, chest pain, shortness of breath, abdominal pain, nausea or vomiting, dysuria, increased urinary frequency, history of bleeding disorder, concern for STI, abnormal discharge, dyspareunia, or other symptoms.  She moved here a few years ago and has not yet established with a primary care provider or OB/GYN.  She states that she does not take any medications regularly.  She had sexual intercourse last week and denies any pain symptoms.  She denies any precipitating trauma for her bleeding here today.  HPI     Past Medical History:  Diagnosis Date  . Anxiety   . Arthritis    knees   . Asthma due to seasonal allergies   . Bipolar disorder ([place])   . Cancer (HCC)    hx of cervical cancer- laser surgery done   . Carpal tunnel syndrome    bilateral   . GERD (gastroesophageal reflux disease)   . OCD (obsessive compulsive disorder)    2013  . PTSD (post-traumatic stress disorder)    2013  . Shortness of breath dyspnea    allergy related   . Trigger  finger    bilateral middle fingers     There are no problems to display for this patient.   Past Surgical History:  Procedure Laterality Date  . CARDIAC CATHETERIZATION    . CHOLECYSTECTOMY N/A 05/29/2016   Procedure: LAPAROSCOPIC CHOLECYSTECTOMY WITH INTRAOPERATIVE CHOLANGIOGRAM;  Surgeon: Jackolyn Confer, MD;  Location: [facility];  Service: General;  Laterality: N/A;  . LUMBAR LAMINECTOMY Right 08/15/2017   Procedure: RIGHT SIDED LUMBAR FOUR-FIVE  MICRODISCECTOMY.;  Surgeon: Phylliss Bob, MD;  Location: [place];  Service: Orthopedics;  Laterality: Right;  . TUBAL LIGATION    . WISDOM TOOTH EXTRACTION       OB History   No obstetric history on file.     History reviewed. No pertinent family history.  Social History   Tobacco Use  . Smoking status: Current Every Day Smoker    Packs/day: 1.00    Years: 24.00    Pack years: 24.00    Types: Cigarettes  . Smokeless tobacco: Never Used  Vaping Use  . Vaping Use: Never used  Substance Use Topics  . Alcohol use: Yes  . Drug use: Yes    Types: Marijuana    Home Medications Prior to Admission medications   Medication Sig Start Date End Date Taking? Authorizing Provider  citalopram (CELEXA) 20 MG tablet Take 20 mg by mouth at bedtime.    [provider]  LORazepam (ATIVAN) 0.5 MG tablet Take 0.5 mg by mouth daily as needed for anxiety.    [provider]  medroxyPROGESTERone (PROVERA) 10 MG tablet Take 1 tablet (10 mg total) by mouth daily for 10 days. 06/16/20 06/26/20  Corena Herter, PA-C  methocarbamol (ROBAXIN) 500 MG tablet Take 1,000 mg by mouth 3 (three) times daily.    [provider]  metroNIDAZOLE (FLAGYL) 500 MG tablet Take 1 tablet (500 mg total) by mouth 2 (two) times daily. For 7 days 12/09/18   Kem Parkinson, PA-C  mupirocin cream (BACTROBAN) 2 % Apply to affected area 3 times daily 12/09/19 12/08/20  Nuala Alpha, DO  naproxen (NAPROSYN) 500 MG tablet Take 1 tablet (500 mg total) by  mouth 2 (two) times daily. 06/16/20   Corena Herter, PA-C  ondansetron (ZOFRAN) 4 MG tablet Take 1 tablet (4 mg total) by mouth every 4 (four) hours as needed for nausea or vomiting. Patient not taking: Reported on 08/08/2017 05/29/16   Jackolyn Confer, MD  oxyCODONE (OXY IR/ROXICODONE) 5 MG immediate release tablet Take 1-2 tablets (5-10 mg total) by mouth every 4 (four) hours as needed for moderate pain, severe pain or breakthrough pain. Patient not taking: Reported on 08/08/2017 05/29/16   Jackolyn Confer, MD  pantoprazole (PROTONIX) 40 MG tablet Take 1 tablet (40 mg total) by mouth daily. 11/20/19 11/19/20  Earleen Newport, MD  PROAIR HFA 108 [phone] Base) MCG/ACT inhaler Inhale 2 puffs into the lungs every 6 (six) hours as needed for wheezing or shortness of breath.  04/24/16   [provider]  promethazine (PHENERGAN) 25 MG tablet Take 1 tablet (25 mg total) by mouth every 6 (six) hours as needed for nausea or vomiting. 08/15/17   McKenzie, Lennie Muckle, PA-C  promethazine (PHENERGAN) 25 MG tablet Take 1 tablet (25 mg total) by mouth every 6 (six) hours as needed for nausea or vomiting. 08/15/17   McKenzie, Lennie Muckle, PA-C    Allergies    Latex and Vicodin [hydrocodone-acetaminophen]  Review of Systems   Review of Systems  Constitutional: Negative for fever.  Gastrointestinal: Negative for abdominal pain, anal bleeding, blood in stool and nausea.  Genitourinary: Positive for menstrual problem. Negative for difficulty urinating, dyspareunia and dysuria.    Physical Exam Updated Vital Signs BP 123/84 (BP Location: Right Arm)   Pulse 80   Temp 97.9 F (36.6 C) (Oral)   Resp 18   Ht 5\' 1"  (1.549 m)   Wt 71.7 kg   LMP 06/15/2020 Comment: heavy, +clots  SpO2 100%   BMI 29.85 kg/m   Physical Exam Vitals and nursing note reviewed. Exam conducted with a chaperone present.  Constitutional:      Appearance: Normal appearance.  HENT:     Head: Normocephalic and atraumatic.  Eyes:      General: No scleral icterus.    Conjunctiva/sclera: Conjunctivae normal.  Cardiovascular:     Rate and Rhythm: Normal rate and regular rhythm.     Pulses: Normal pulses.     Heart sounds: Normal heart sounds.  Pulmonary:     Effort: Pulmonary effort is normal. No respiratory distress.     Breath sounds: Normal breath sounds.  Abdominal:     Comments: Soft, nondistended.  Mild TTP in suprapubic/pelvic region, but no TTP elsewhere.  No overlying skin changes.  Genitourinary:    Comments: Patient declined. Skin:    General: Skin is dry.     Capillary Refill: Capillary refill takes  less than 2 seconds.  Neurological:     Mental Status: She is alert and oriented to person, place, and time.     GCS: GCS eye subscore is 4. GCS verbal subscore is 5. GCS motor subscore is 6.  Psychiatric:        Mood and Affect: Mood normal.        Behavior: Behavior normal.        Thought Content: Thought content normal.     ED Results / Procedures / Treatments   Labs (all labs ordered are listed, but only abnormal results are displayed) Labs Reviewed  CBC WITH DIFFERENTIAL/PLATELET  BASIC METABOLIC PANEL  HCG, QUANTITATIVE, PREGNANCY    EKG None  Radiology No results found.  Procedures Procedures (including critical care time)  Medications Ordered in ED Medications - No data to display  ED Course  I have reviewed the triage vital signs and the nursing notes.  Pertinent labs & imaging results that were available during my care of the patient were reviewed by me and considered in my medical decision making (see chart for details).    MDM Rules/Calculators/A&P                          DDx:    Structural - (fibroids, adenomyosis, polyps)       Non-Structural - (coagulopathy, ovulatory dysfunction, malignancy)  The most common cause of abnormal vaginal bleeding is to be uterine bleeding related to ovulatory dysfunction.  Patient denies any significant postcoital pain or bleeding  concerning for traumatic laceration.  Patient declines GU exam here in the ED.  No abnormal bruising, bleeding, or family history of coagulopathies.  No petechiae on physical exam and do not feel as though PT/INR or clotting work-up is warranted at this time.  Obtained CBC to evaluate for anemia which was  unremarkable.  Vital signs are reassuring and she is hemodynamically stable.    Given brevity of bleeding and discomfort in conjunction with her reassuring physical exam and laboratory work-up, do not feel as though emergent pelvic US is warranted.   Will treat with Provera 10 mg daily x10 to 14 days as well as Naproxen 500 mg BID as NSAIDs have been shown to decrease cramping and bleeding by 50%.  Will also refer to OB/GYN as she may ultimately require transvaginal ultrasound and ultimately possible D&C.  If symptoms fail to improve despite conservative management, consider TSH outpatient to evaluate for thyroid hormone imbalance as contributing factor.    Strict ED return precautions discussed.  All of the evaluation and work-up results were discussed with the patient and any family at bedside. They were provided opportunity to ask any additional questions and have none at this time. They have expressed understanding of verbal discharge instructions as well as return precautions and are agreeable to the plan.   Final Clinical Impression(s) / ED Diagnoses Final diagnoses:  Vaginal bleeding    Rx / DC Orders ED Discharge Orders         Ordered    naproxen (NAPROSYN) 500 MG tablet  2 times daily,   Status:  Discontinued     Reprint     06/16/20 1937    naproxen (NAPROSYN) 500 MG tablet  2 times daily     Discontinue  Reprint     06/16/20 2001    medroxyPROGESTERone (PROVERA) 10 MG tablet  Daily     Discontinue  Reprint     06/16/20 2001  Corena Herter, PA-C 06/16/20 2004    Veryl Speak, MD 06/16/20 [phone]

## 2020-06-16 NOTE — Discharge Instructions (Signed)
Please read the attachment on abnormal uterine bleeding.  Please take your medications, as directed.  You will need to follow-up with OB/GYN for ongoing evaluation and management.  Please call them tomorrow.  You may ultimately benefit from an ultrasound to further evaluate for structural causes of your bleeding.  Return to the ED or seek immediate medical attention should you experience any new or worsening symptoms.

## 2020-06-16 NOTE — ED Triage Notes (Signed)
Pt with vaginal bleeding that is heavier than usual, started last night.  Pt states large clots are passing as well.  Pt states her tubes are tied.

## 2020-09-16 ENCOUNTER — Encounter (HOSPITAL_COMMUNITY): Payer: Self-pay | Admitting: Emergency Medicine

## 2020-09-16 ENCOUNTER — Other Ambulatory Visit: Payer: Self-pay

## 2020-09-16 ENCOUNTER — Emergency Department (HOSPITAL_COMMUNITY)
Admission: EM | Admit: 2020-09-16 | Discharge: 2020-09-16 | Disposition: A | Discharge status: Home / Self Care | Payer: BC Managed Care – PPO | Attending: Emergency Medicine | Admitting: Emergency Medicine

## 2020-09-16 DIAGNOSIS — N760 Acute vaginitis: Secondary | ICD-10-CM | POA: Insufficient documentation

## 2020-09-16 DIAGNOSIS — J45909 Unspecified asthma, uncomplicated: Secondary | ICD-10-CM | POA: Diagnosis not present

## 2020-09-16 DIAGNOSIS — Z7951 Long term (current) use of inhaled steroids: Secondary | ICD-10-CM | POA: Diagnosis not present

## 2020-09-16 DIAGNOSIS — Z8541 Personal history of malignant neoplasm of cervix uteri: Secondary | ICD-10-CM | POA: Insufficient documentation

## 2020-09-16 DIAGNOSIS — F17210 Nicotine dependence, cigarettes, uncomplicated: Secondary | ICD-10-CM | POA: Insufficient documentation

## 2020-09-16 DIAGNOSIS — Z91040 Latex allergy status: Secondary | ICD-10-CM | POA: Diagnosis not present

## 2020-09-16 DIAGNOSIS — B9689 Other specified bacterial agents as the cause of diseases classified elsewhere: Secondary | ICD-10-CM | POA: Insufficient documentation

## 2020-09-16 DIAGNOSIS — N898 Other specified noninflammatory disorders of vagina: Secondary | ICD-10-CM | POA: Diagnosis not present

## 2020-09-16 DIAGNOSIS — Z79899 Other long term (current) drug therapy: Secondary | ICD-10-CM | POA: Insufficient documentation

## 2020-09-16 LAB — WET PREP, GENITAL
Sperm: NONE SEEN
Trich, Wet Prep: NONE SEEN
Yeast Wet Prep HPF POC: NONE SEEN

## 2020-09-16 LAB — URINALYSIS, ROUTINE W REFLEX MICROSCOPIC
Bilirubin Urine: NEGATIVE
Glucose, UA: NEGATIVE mg/dL
Hgb urine dipstick: NEGATIVE
Ketones, ur: NEGATIVE mg/dL
Leukocytes,Ua: NEGATIVE
Nitrite: NEGATIVE
Protein, ur: NEGATIVE mg/dL
Specific Gravity, Urine: 1.015 (ref 1.005–1.030)
pH: 5 (ref 5.0–8.0)

## 2020-09-16 MED ORDER — METRONIDAZOLE 500 MG PO TABS: 500.0000 mg | ORAL_TABLET | Freq: Two times a day (BID) | ORAL | 0 refills | Status: DC

## 2020-09-16 NOTE — Discharge Instructions (Signed)
You have been seen here for vaginal discharge.  Lab work reveals you have bacterial vaginosis.  I prescribed you antibiotics please take as prescribed.  Do not drink alcohol while taking this medication as it can have an adverse reaction and make you very sick.  I want you to follow-up with your primary care provider for further evaluation management.  I want to come back to emergency department if develop chest pain, shortness of breath, severe abdominal pain, uncontrolled nausea, vomiting, diarrhea, urinary symptoms as these symptoms require further evaluation management.

## 2020-09-16 NOTE — ED Triage Notes (Signed)
Pt c/o vaginal discharge x1 week with irritation. Pt states she took some diflucan she had with no relief.

## 2020-09-16 NOTE — ED Provider Notes (Signed)
[facility] EMERGENCY DEPARTMENT Provider Note   CSN: [id number] Arrival date & time: 09/16/20  2979     History Chief Complaint  Patient presents with  . Vaginal Discharge    Teresa [name] is a 44 y.o. female.  HPI   Patient with significant medical history of anxiety, asthma, bipolar, cervical cancer, GERD, presents to the emergency department with chief complaint of vaginal discharge and vaginal irritation x5 days.  Patient states she feels like she has a itchiness in her vagina and has noticed a clear like discharge.  She denies abdominal pain, pelvic pain, dysuria, urinary urgency, frequency, or vaginal bleeding.  She states she has taken some Diflucan because she thought she might have a yeast infection but states is did not help.  She has been sexually active and she feels she may have bacterial vaginosis and has come here to be further evaluated.  She denies any alleviating factors at this time.  Patient denies headache, fever, chills, shortness of breath, chest pain, abdominal pain, nausea, vomiting, diarrhea, pedal edema.  Past Medical History:  Diagnosis Date  . Anxiety   . Arthritis    knees   . Asthma due to seasonal allergies   . Bipolar disorder ([place])   . Cancer (HCC)    hx of cervical cancer- laser surgery done   . Carpal tunnel syndrome    bilateral   . GERD (gastroesophageal reflux disease)   . OCD (obsessive compulsive disorder)    2013  . PTSD (post-traumatic stress disorder)    2013  . Shortness of breath dyspnea    allergy related   . Trigger finger    bilateral middle fingers     There are no problems to display for this patient.   Past Surgical History:  Procedure Laterality Date  . CARDIAC CATHETERIZATION    . CHOLECYSTECTOMY N/A 05/29/2016   Procedure: LAPAROSCOPIC CHOLECYSTECTOMY WITH INTRAOPERATIVE CHOLANGIOGRAM;  Surgeon: Jackolyn Confer, MD;  Location: [facility];  Service: General;  Laterality: N/A;  . LUMBAR LAMINECTOMY Right 08/15/2017    Procedure: RIGHT SIDED LUMBAR FOUR-FIVE  MICRODISCECTOMY.;  Surgeon: Phylliss Bob, MD;  Location: [place];  Service: Orthopedics;  Laterality: Right;  . TUBAL LIGATION    . WISDOM TOOTH EXTRACTION       OB History   No obstetric history on file.     History reviewed. No pertinent family history.  Social History   Tobacco Use  . Smoking status: Current Every Day Smoker    Packs/day: 1.00    Years: 24.00    Pack years: 24.00    Types: Cigarettes  . Smokeless tobacco: Never Used  Vaping Use  . Vaping Use: Never used  Substance Use Topics  . Alcohol use: Yes  . Drug use: Yes    Types: Marijuana    Home Medications Prior to Admission medications   Medication Sig Start Date End Date Taking? Authorizing Provider  citalopram (CELEXA) 20 MG tablet Take 20 mg by mouth at bedtime.    [provider]  LORazepam (ATIVAN) 0.5 MG tablet Take 0.5 mg by mouth daily as needed for anxiety.    [provider]  medroxyPROGESTERone (PROVERA) 10 MG tablet Take 1 tablet (10 mg total) by mouth daily for 10 days. 06/16/20 06/26/20  Corena Herter, PA-C  methocarbamol (ROBAXIN) 500 MG tablet Take 1,000 mg by mouth 3 (three) times daily.    [provider]  metroNIDAZOLE (FLAGYL) 500 MG tablet Take 1 tablet (500 mg total)  by mouth 2 (two) times daily. For 7 days 12/09/18   Triplett, Tammy, PA-C  metroNIDAZOLE (FLAGYL) 500 MG tablet Take 1 tablet (500 mg total) by mouth 2 (two) times daily. 09/16/20   Marcello Fennel, PA-C  mupirocin cream Drue Stager) 2 % Apply to affected area 3 times daily 12/09/19 12/08/20  Nuala Alpha, DO  naproxen (NAPROSYN) 500 MG tablet Take 1 tablet (500 mg total) by mouth 2 (two) times daily. 06/16/20   Corena Herter, PA-C  ondansetron (ZOFRAN) 4 MG tablet Take 1 tablet (4 mg total) by mouth every 4 (four) hours as needed for nausea or vomiting. Patient not taking: Reported on 08/08/2017 05/29/16   Jackolyn Confer, MD  oxyCODONE (OXY  IR/ROXICODONE) 5 MG immediate release tablet Take 1-2 tablets (5-10 mg total) by mouth every 4 (four) hours as needed for moderate pain, severe pain or breakthrough pain. Patient not taking: Reported on 08/08/2017 05/29/16   Jackolyn Confer, MD  pantoprazole (PROTONIX) 40 MG tablet Take 1 tablet (40 mg total) by mouth daily. 11/20/19 11/19/20  Earleen Newport, MD  PROAIR HFA [phone] Base) MCG/ACT inhaler Inhale 2 puffs into the lungs every 6 (six) hours as needed for wheezing or shortness of breath.  04/24/16   [provider]  promethazine (PHENERGAN) 25 MG tablet Take 1 tablet (25 mg total) by mouth every 6 (six) hours as needed for nausea or vomiting. 08/15/17   McKenzie, Lennie Muckle, PA-C  promethazine (PHENERGAN) 25 MG tablet Take 1 tablet (25 mg total) by mouth every 6 (six) hours as needed for nausea or vomiting. 08/15/17   McKenzie, Lennie Muckle, PA-C    Allergies    Latex and Vicodin [hydrocodone-acetaminophen]  Review of Systems   Review of Systems  Constitutional: Negative for chills and fever.  HENT: Negative for congestion, tinnitus and trouble swallowing.   Eyes: Negative for visual disturbance.  Respiratory: Negative for shortness of breath.   Cardiovascular: Negative for chest pain.  Gastrointestinal: Negative for abdominal pain, diarrhea, nausea and vomiting.  Genitourinary: Positive for vaginal discharge. Negative for decreased urine volume, dysuria, enuresis, flank pain, vaginal bleeding and vaginal pain.  Musculoskeletal: Negative for back pain.  Skin: Negative for rash.  Neurological: Negative for dizziness, light-headedness and numbness.  Hematological: Does not bruise/bleed easily.    Physical Exam Updated Vital Signs BP 117/80 (BP Location: Right Arm)   Pulse 89   Temp 98.7 F (37.1 C) (Oral)   Resp 17   Ht 5\' 1"  (1.549 m)   Wt 71.7 kg   SpO2 97%   BMI 29.85 kg/m   Physical Exam Vitals and nursing note reviewed. Exam conducted with a chaperone present.    Constitutional:      General: She is not in acute distress.    Appearance: Normal appearance. She is not ill-appearing.  HENT:     Head: Normocephalic and atraumatic.     Nose: No congestion or rhinorrhea.     Mouth/Throat:     Mouth: Mucous membranes are moist.     Pharynx: Oropharynx is clear.  Eyes:     General: No scleral icterus.       Right eye: No discharge.        Left eye: No discharge.  Cardiovascular:     Rate and Rhythm: Normal rate and regular rhythm.     Pulses: Normal pulses.     Heart sounds: No murmur heard.  No friction rub. No gallop.   Pulmonary:  Effort: No respiratory distress.     Breath sounds: No stridor. No wheezing, rhonchi or rales.  Abdominal:     General: There is no distension.     Palpations: Abdomen is soft.     Tenderness: There is no abdominal tenderness. There is no right CVA tenderness, left CVA tenderness or guarding.  Genitourinary:    General: Normal vulva.     Vagina: Vaginal discharge present.     Comments: Pelvic exam finding exterior genitalia was examined there was no lesions, rashes or discharge noted.  Vaginal canal was patent, pink, no lesions or trauma noted.  Cervix was visualized there was no lesions, watery white discharge was noted, no vaginal bleeding noted. Musculoskeletal:        General: No swelling or tenderness.  Skin:    General: Skin is warm and dry.     Capillary Refill: Capillary refill takes less than 2 seconds.     Findings: No rash.  Neurological:     General: No focal deficit present.     Mental Status: She is alert and oriented to person, place, and time.  Psychiatric:        Mood and Affect: Mood normal.     ED Results / Procedures / Treatments   Labs (all labs ordered are listed, but only abnormal results are displayed) Labs Reviewed  WET PREP, GENITAL - Abnormal; Notable for the following components:      Result Value   Clue Cells Wet Prep HPF POC PRESENT (*)    WBC, Wet Prep HPF POC RARE (*)     All other components within normal limits  URINALYSIS, ROUTINE W REFLEX MICROSCOPIC  GC/CHLAMYDIA PROBE AMP () NOT AT [facility]    EKG None  Radiology No results found.  Procedures Procedures (including critical care time)  Medications Ordered in ED Medications - No data to display  ED Course  I have reviewed the triage vital signs and the nursing notes.  Pertinent labs & imaging results that were available during my care of the patient were reviewed by me and considered in my medical decision making (see chart for details).    MDM Rules/Calculators/A&P                          I have personally reviewed all imaging, labs and have interpreted them.  Patient presents to the emergency department with chief complaint of vaginal itchiness and discharge x5 days.  She was alert and oriented, did not appear in acute distress, vital signs reassuring.  On exam lung sounds are clear bilaterally, abdomen soft nontender to palpation, no pedal edema noted.  Will order UA, pelvic exam wet prep and gonorrhea chlamydia test.  Pelvic exam was performed with chaperone present, white watery discharge noted, no other gross abnormalities. UA came back unremarkable negative nitrates or leukocytes, wet prep showed positive clue cells with some white blood cells.  Gonorrhea chlamydia test pending.  I have low suspicion for GU malignancy as patient denies fever, weight loss, vaginal bleeding, on pelvic exam no gross abnormalities noted.  Low suspicion for PUD as she denies fever, chills, abdominal pain, abdomen was soft nontender to palpation, no pelvic pain during exam.  Low suspicion for STD, [place] or UTI as she denies any urinary symptoms, negative CVA tenderness.  I suspect patient suffering from bacterial vaginosis will start her on Flagyl and encourage her to follow-up with her primary care doctor for further evaluation management.  Her gonorrhea and Chlamydia tests are pending if positive  hospital will contact her for further evaluation.  Patient resting comfortably, vital signs reassuring, no indication for hospital admission.  Patient is given at home care and strict return precautions.  Patient verbalized that she understood and agreed to plan. Final Clinical Impression(s) / ED Diagnoses Final diagnoses:  BV (bacterial vaginosis)    Rx / DC Orders ED Discharge Orders         Ordered    metroNIDAZOLE (FLAGYL) 500 MG tablet  2 times daily        09/16/20 1126           Aron Baba 09/16/20 1231    Virgel Manifold, MD 09/18/20 2043

## 2020-09-19 LAB — GC/CHLAMYDIA PROBE AMP (~~LOC~~) NOT AT ARMC
Chlamydia: NEGATIVE
Comment: NEGATIVE
Comment: NORMAL
Neisseria Gonorrhea: NEGATIVE

## 2020-09-29 DIAGNOSIS — N912 Amenorrhea, unspecified: Secondary | ICD-10-CM | POA: Diagnosis not present

## 2020-09-29 DIAGNOSIS — R7309 Other abnormal glucose: Secondary | ICD-10-CM | POA: Diagnosis not present

## 2020-09-29 DIAGNOSIS — Z6827 Body mass index (BMI) 27.0-27.9, adult: Secondary | ICD-10-CM | POA: Diagnosis not present

## 2020-09-29 DIAGNOSIS — G4709 Other insomnia: Secondary | ICD-10-CM | POA: Diagnosis not present

## 2020-09-29 DIAGNOSIS — N644 Mastodynia: Secondary | ICD-10-CM | POA: Diagnosis not present

## 2020-09-29 DIAGNOSIS — R102 Pelvic and perineal pain: Secondary | ICD-10-CM | POA: Diagnosis not present

## 2020-09-29 DIAGNOSIS — Z1331 Encounter for screening for depression: Secondary | ICD-10-CM | POA: Diagnosis not present

## 2020-09-29 DIAGNOSIS — Z0001 Encounter for general adult medical examination with abnormal findings: Secondary | ICD-10-CM | POA: Diagnosis not present

## 2020-10-03 ENCOUNTER — Other Ambulatory Visit (HOSPITAL_COMMUNITY): Payer: Self-pay | Admitting: Physician Assistant

## 2020-10-03 DIAGNOSIS — R928 Other abnormal and inconclusive findings on diagnostic imaging of breast: Secondary | ICD-10-CM

## 2020-10-12 ENCOUNTER — Encounter: Payer: Self-pay | Admitting: Adult Health

## 2020-10-12 ENCOUNTER — Ambulatory Visit (INDEPENDENT_AMBULATORY_CARE_PROVIDER_SITE_OTHER): Payer: BC Managed Care – PPO | Admitting: Adult Health

## 2020-10-12 ENCOUNTER — Other Ambulatory Visit (HOSPITAL_COMMUNITY)
Admission: RE | Admit: 2020-10-12 | Discharge: 2020-10-12 | Disposition: A | Discharge status: Home / Self Care | Payer: BC Managed Care – PPO | Source: Ambulatory Visit | Attending: Adult Health | Admitting: Adult Health

## 2020-10-12 VITALS — BP 115/75 | HR 74 | Ht 60.0 in | Wt 152.0 lb

## 2020-10-12 DIAGNOSIS — F32A Depression, unspecified: Secondary | ICD-10-CM

## 2020-10-12 DIAGNOSIS — Z01419 Encounter for gynecological examination (general) (routine) without abnormal findings: Secondary | ICD-10-CM

## 2020-10-12 DIAGNOSIS — Z1211 Encounter for screening for malignant neoplasm of colon: Secondary | ICD-10-CM | POA: Diagnosis not present

## 2020-10-12 DIAGNOSIS — N920 Excessive and frequent menstruation with regular cycle: Secondary | ICD-10-CM | POA: Insufficient documentation

## 2020-10-12 DIAGNOSIS — R140 Abdominal distension (gaseous): Secondary | ICD-10-CM | POA: Insufficient documentation

## 2020-10-12 DIAGNOSIS — R232 Flushing: Secondary | ICD-10-CM | POA: Insufficient documentation

## 2020-10-12 DIAGNOSIS — N921 Excessive and frequent menstruation with irregular cycle: Secondary | ICD-10-CM | POA: Diagnosis not present

## 2020-10-12 DIAGNOSIS — F419 Anxiety disorder, unspecified: Secondary | ICD-10-CM | POA: Insufficient documentation

## 2020-10-12 LAB — HEMOCCULT GUIAC POC 1CARD (OFFICE): Fecal Occult Blood, POC: NEGATIVE

## 2020-10-12 NOTE — Addendum Note (Signed)
Addended by: Janece Canterbury on: 10/12/2020 03:47 PM   Modules accepted: Orders

## 2020-10-12 NOTE — Patient Instructions (Signed)
Menorrhagia  Menorrhagia is a condition in which menstrual periods are heavy or last longer than normal. With menorrhagia, most periods a woman has may cause enough blood loss and cramping that she becomes unable to take part in her usual activities. What are the causes? Common causes of this condition include:  Noncancerous growths in the uterus (polyps or fibroids).  An imbalance of the estrogen and progesterone hormones.  One of the ovaries not releasing an egg during one or more months.  A problem with the thyroid gland (hypothyroid).  Side effects of having an intrauterine device (IUD).  Side effects of some medicines, such as anti-inflammatory medicines or blood thinners.  A bleeding disorder that stops the blood from clotting normally. In some cases, the cause of this condition is not known. What are the signs or symptoms? Symptoms of this condition include:  Routinely having to change your pad or tampon every 1-2 hours because it is completely soaked.  Needing to use pads and tampons at the same time because of heavy bleeding.  Needing to wake up to change your pads or tampons during the night.  Passing blood clots larger than 1 inch (2.5 cm) in size.  Having bleeding that lasts for more than 7 days.  Having symptoms of low iron levels (anemia), such as tiredness, fatigue, or shortness of breath. How is this diagnosed? This condition may be diagnosed based on:  A physical exam.  Your symptoms and menstrual history.  Tests, such as: ? Blood tests to check if you are pregnant or have hormonal changes, a bleeding or thyroid disorder, anemia, or other problems. ? Pap test to check for cancerous changes, infections, or inflammation. ? Endometrial biopsy. This test involves removing a tissue sample from the lining of the uterus (endometrium) to be examined under a microscope. ? Pelvic ultrasound. This test uses sound waves to create images of your uterus, ovaries, and  vagina. The images can show if you have fibroids or other growths. ? Hysteroscopy. For this test, a small telescope is used to look inside your uterus. How is this treated? Treatment may not be needed for this condition. If it is needed, the best treatment for you will depend on:  Whether you need to prevent pregnancy.  Your desire to have children in the future.  The cause and severity of your bleeding.  Your personal preference. Medicines are the first step in treatment. You may be treated with:  Hormonal birth control methods. These treatments reduce bleeding during your menstrual period. They include: ? Birth control pills. ? Skin patch. ? Vaginal ring. ? Shots (injections) that you get every 3 months. ? Hormonal IUD (intrauterine device). ? Implants that go under the skin.  Medicines that thicken blood and slow bleeding.  Medicines that reduce swelling, such as ibuprofen.  Medicines that contain an artificial (synthetic) hormone called progestin.  Medicines that make the ovaries stop working for a short time.  Iron supplements to treat anemia. If medicines do not work, surgery may be done. Surgical options may include:  Dilation and curettage (D&C). In this procedure, your health care provider opens (dilates) your cervix and then scrapes or suctions tissue from the endometrium to reduce menstrual bleeding.  Operative hysteroscopy. In this procedure, a small tube with a light on the end (hysteroscope) is used to view your uterus and help remove polyps that may be causing heavy periods.  Endometrial ablation. This is when various techniques are used to permanently destroy your entire endometrium.  After endometrial ablation, most women have little or no menstrual flow. This procedure reduces your ability to become pregnant.  Endometrial resection. In this procedure, an electrosurgical wire loop is used to remove the endometrium. This procedure reduces your ability to become  pregnant.  Hysterectomy. This is surgical removal of the uterus. This is a permanent procedure that stops menstrual periods. Pregnancy is not possible after a hysterectomy. Follow these instructions at home: Medicines  Take over-the-counter and prescription medicines exactly as told by your health care provider. This includes iron pills.  Do not change or switch medicines without asking your health care provider.  Do not take aspirin or medicines that contain aspirin 1 week before or during your menstrual period. Aspirin may make bleeding worse. General instructions  If you need to change your sanitary pad or tampon more than once every 2 hours, limit your activity until the bleeding stops.  Iron pills can cause constipation. To prevent or treat constipation while you are taking prescription iron supplements, your health care provider may recommend that you: ? Drink enough fluid to keep your urine clear or pale yellow. ? Take over-the-counter or prescription medicines. ? Eat foods that are high in fiber, such as fresh fruits and vegetables, whole grains, and beans. ? Limit foods that are high in fat and processed sugars, such as fried and sweet foods.  Eat well-balanced meals, including foods that are high in iron. Foods that have a lot of iron include leafy green vegetables, meat, liver, eggs, and whole grain breads and cereals.  Do not try to lose weight until the abnormal bleeding has stopped and your blood iron level is back to normal. If you need to lose weight, work with your health care provider to lose weight safely.  Keep all follow-up visits as told by your health care provider. This is important. Contact a health care provider if:  You soak through a pad or tampon every 1 or 2 hours, and this happens every time you have a period.  You need to use pads and tampons at the same time because you are bleeding so much.  You have nausea, vomiting, diarrhea, or other problems  related to medicines you are taking. Get help right away if:  You soak through more than a pad or tampon in 1 hour.  You pass clots bigger than 1 inch (2.5 cm) wide.  You feel short of breath.  You feel like your heart is beating too fast.  You feel dizzy or faint.  You feel very weak or tired. Summary  Menorrhagia is a condition in which menstrual periods are heavy or last longer than normal.  Treatment will depend on the cause of the condition and may include medicines or procedures.  Take over-the-counter and prescription medicines exactly as told by your health care provider. This includes iron pills.  Get help right away if you have heavy bleeding that soaks through more than a pad or tampon in 1 hour, you are passing large clots, or you feel dizzy, faint or short of breath. This information is not intended to replace advice given to you by your health care provider. Make sure you discuss any questions you have with your health care provider. Document Revised: 03/26/2018 Document Reviewed: 12/10/2016 Elsevier Patient Education  2020 [employer]. Menopause Menopause is the normal time of life when menstrual periods stop completely. It is usually confirmed by 12 months without a menstrual period. The transition to menopause (perimenopause) most often happens between  the ages of 104 and 76. During perimenopause, hormone levels change in your body, which can cause symptoms and affect your health. Menopause may increase your risk for:  Loss of bone (osteoporosis), which causes bone breaks (fractures).  Depression.  Hardening and narrowing of the arteries (atherosclerosis), which can cause heart attacks and strokes. What are the causes? This condition is usually caused by a natural change in hormone levels that happens as you get older. The condition may also be caused by surgery to remove both ovaries (bilateral oophorectomy). What increases the risk? This condition is more  likely to start at an earlier age if you have certain medical conditions or treatments, including:  A tumor of the pituitary gland in the brain.  A disease that affects the ovaries and hormone production.  Radiation treatment for cancer.  Certain cancer treatments, such as chemotherapy or hormone (anti-estrogen) therapy.  Heavy smoking and excessive alcohol use.  Family history of early menopause. This condition is also more likely to develop earlier in women who are very thin. What are the signs or symptoms? Symptoms of this condition include:  Hot flashes.  Irregular menstrual periods.  Night sweats.  Changes in feelings about sex. This could be a decrease in sex drive or an increased comfort around your sexuality.  Vaginal dryness and thinning of the vaginal walls. This may cause painful intercourse.  Dryness of the skin and development of wrinkles.  Headaches.  Problems sleeping (insomnia).  Mood swings or irritability.  Memory problems.  Weight gain.  Hair growth on the face and chest.  Bladder infections or problems with urinating. How is this diagnosed? This condition is diagnosed based on your medical history, a physical exam, your age, your menstrual history, and your symptoms. Hormone tests may also be done. How is this treated? In some cases, no treatment is needed. You and your health care provider should make a decision together about whether treatment is necessary. Treatment will be based on your individual condition and preferences. Treatment for this condition focuses on managing symptoms. Treatment may include:  Menopausal hormone therapy (MHT).  Medicines to treat specific symptoms or complications.  Acupuncture.  Vitamin or herbal supplements. Before starting treatment, make sure to let your health care provider know if you have a personal or family history of:  Heart disease.  Breast cancer.  Blood  clots.  Diabetes.  Osteoporosis. Follow these instructions at home: Lifestyle  Do not use any products that contain nicotine or tobacco, such as cigarettes and e-cigarettes. If you need help quitting, ask your health care provider.  Get at least 30 minutes of physical activity on 5 or more days each week.  Avoid alcoholic and caffeinated beverages, as well as spicy foods. This may help prevent hot flashes.  Get 7-8 hours of sleep each night.  If you have hot flashes, try: ? Dressing in layers. ? Avoiding things that may trigger hot flashes, such as spicy food, warm places, or stress. ? Taking slow, deep breaths when a hot flash starts. ? Keeping a fan in your home and office.  Find ways to manage stress, such as deep breathing, meditation, or journaling.  Consider going to group therapy with other women who are having menopause symptoms. Ask your health care provider about recommended group therapy meetings. Eating and drinking  Eat a healthy, balanced diet that contains whole grains, lean protein, low-fat dairy, and plenty of fruits and vegetables.  Your health care provider may recommend adding more soy to  your diet. Foods that contain soy include tofu, tempeh, and soy milk.  Eat plenty of foods that contain calcium and vitamin D for bone health. Items that are rich in calcium include low-fat milk, yogurt, beans, almonds, sardines, broccoli, and kale. Medicines  Take over-the-counter and prescription medicines only as told by your health care provider.  Talk with your health care provider before starting any herbal supplements. If prescribed, take vitamins and supplements as told by your health care provider. These may include: ? Calcium. Women age 41 and older should get 1,200 mg (milligrams) of calcium every day. ? Vitamin D. Women need 600-800 International Units of vitamin D each day. ? Vitamins B12 and B6. Aim for 50 micrograms of B12 and 1.5 mg of B6 each day. General  instructions  Keep track of your menstrual periods, including: ? When they occur. ? How heavy they are and how long they last. ? How much time passes between periods.  Keep track of your symptoms, noting when they start, how often you have them, and how long they last.  Use vaginal lubricants or moisturizers to help with vaginal dryness and improve comfort during sex.  Keep all follow-up visits as told by your health care provider. This is important. This includes any group therapy or counseling. Contact a health care provider if:  You are still having menstrual periods after age 31.  You have pain during sex.  You have not had a period for 12 months and you develop vaginal bleeding. Get help right away if:  You have: ? Severe depression. ? Excessive vaginal bleeding. ? Pain when you urinate. ? A fast or irregular heart beat (palpitations). ? Severe headaches. ? Abdomen (abdominal) pain or severe indigestion.  You fell and you think you have a broken bone.  You develop leg or chest pain.  You develop vision problems.  You feel a lump in your breast. Summary  Menopause is the normal time of life when menstrual periods stop completely. It is usually confirmed by 12 months without a menstrual period.  The transition to menopause (perimenopause) most often happens between the ages of 62 and 30.  Symptoms can be managed through medicines, lifestyle changes, and complementary therapies such as acupuncture.  Eat a balanced diet that is rich in nutrients to promote bone health and heart health and to manage symptoms during menopause. This information is not intended to replace advice given to you by your health care provider. Make sure you discuss any questions you have with your health care provider. Document Revised: 11/29/2017 Document Reviewed: 01/19/2017 Elsevier Patient Education  2020 [employer].

## 2020-10-12 NOTE — Progress Notes (Signed)
  Subjective:     Patient ID: Teresa [name], female   DOB: 1976-08-31, 44 y.o.   MRN: [id number]  HPI Vivica is a 44 year old white female,single, [id number] in for pap and pelvic. She had physical with PCP, she works at [employer].  PCP is [place].   Review of Systems +heavy periods +bloating +hot flashes  Does not sleep well  Moody and anxious   Reviewed past medical,surgical, social and family history. Reviewed medications and allergies.     Objective:   Physical Exam BP 115/75 (BP Location: Right Arm, Patient Position: Sitting, Cuff Size: Normal)   Pulse 74   Ht 5' (1.524 m)   Wt 152 lb (68.9 kg)   BMI 29.69 kg/m   Skin warm and dry.Pelvic: external genitalia is normal in appearance no lesions, vagina: white discharge without odor,urethra has no lesions or masses noted, cervix: bulbous, irregular at os, pap with high risk HPV performed, uterus: normal size, shape and contour, non tender, no masses felt, adnexa: no masses or tenderness noted. Bladder is non tender and no masses felt. On rectal exam has good tone, no masses felt, hemoccult is negative.  AA is 5 Fall risk is low PHQ 9 score is 11, no SI    Upstream - 10/12/20 1419      Pregnancy Intention Screening   Does the patient want to become pregnant in the next year? No    Does the patient's partner want to become pregnant in the next year? No    Would the patient like to discuss contraceptive options today? No      Contraception Wrap Up   Current Method Female Sterilization    End Method Female Sterilization    Contraception Counseling Provided No         Examination chaperoned by Celene Squibb LPN     Assessment:     1. Encounter for gynecological examination with Papanicolaou smear of cervix Pap sent Pap in 3 years if normal Physical with PCP Get mammogram  \ 2. Encounter for screening fecal occult blood testing   3. Menorrhagia with irregular cycle Pelvic US scheduled at [facility] 10/19 at 3:30 pm   Review handout on menorrhagia   4. Bloating  5. Hot flashes Review handout on menopause   6. Depression, unspecified depression type Follow up with PCP  7. Anxiety Follow up with PCP    Plan:     Return to see me in 2 weeks to discuss [place] and options like megace or HRT

## 2020-10-13 DIAGNOSIS — M545 Low back pain, unspecified: Secondary | ICD-10-CM | POA: Diagnosis not present

## 2020-10-13 DIAGNOSIS — R109 Unspecified abdominal pain: Secondary | ICD-10-CM | POA: Diagnosis not present

## 2020-10-13 DIAGNOSIS — R110 Nausea: Secondary | ICD-10-CM | POA: Diagnosis not present

## 2020-10-17 DIAGNOSIS — F419 Anxiety disorder, unspecified: Secondary | ICD-10-CM | POA: Diagnosis not present

## 2020-10-18 ENCOUNTER — Other Ambulatory Visit: Payer: Self-pay

## 2020-10-18 ENCOUNTER — Ambulatory Visit (HOSPITAL_COMMUNITY)
Admission: RE | Admit: 2020-10-18 | Discharge: 2020-10-18 | Disposition: A | Discharge status: Home / Self Care | Payer: BC Managed Care – PPO | Source: Ambulatory Visit | Attending: Adult Health | Admitting: Adult Health

## 2020-10-18 DIAGNOSIS — N921 Excessive and frequent menstruation with irregular cycle: Secondary | ICD-10-CM | POA: Diagnosis not present

## 2020-10-18 LAB — CYTOLOGY - PAP
Adequacy: ABSENT
Chlamydia: NEGATIVE
Comment: NEGATIVE
Comment: NEGATIVE
Comment: NORMAL
Diagnosis: NEGATIVE
High risk HPV: NEGATIVE
Neisseria Gonorrhea: NEGATIVE

## 2020-10-18 IMAGING — US US PELVIS COMPLETE WITH TRANSVAGINAL
1 series · 13 of 25 positions shown · non-contrast
Comparison: [DATE]

CLINICAL DATA: Menorrhagia with irregular cycle; unknown LMP

EXAM:
TRANSABDOMINAL AND TRANSVAGINAL ULTRASOUND OF PELVIS
TECHNIQUE: Both transabdominal and transvaginal ultrasound examinations of the
pelvis were performed. Transabdominal technique was performed for
global imaging of the pelvis including uterus, ovaries, adnexal
regions, and pelvic cul-de-sac. It was necessary to proceed with
endovaginal exam following the transabdominal exam to visualize the
lower uterine segment and ovaries.

[Series 1: us pelvic complete with transvaginal · 13 of 113 slices shown]
[im 1/113]
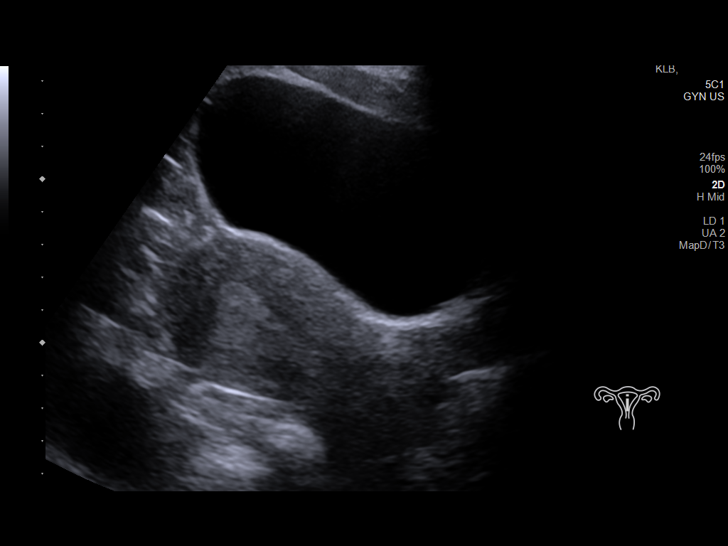
[im 10/113]
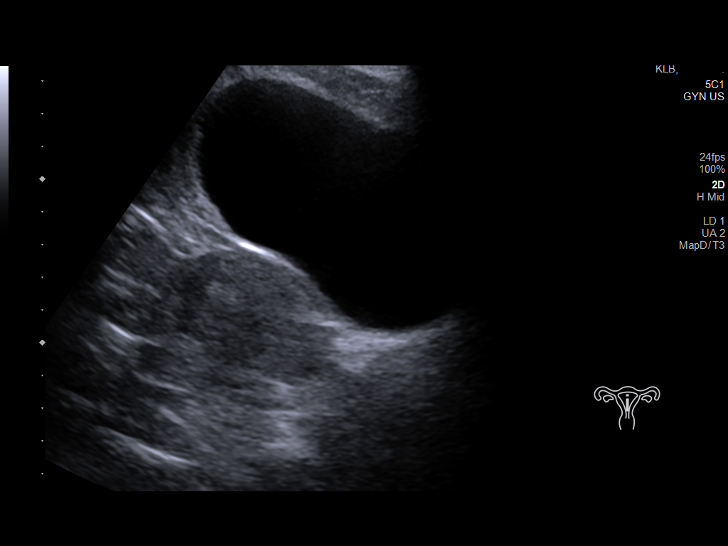
[im 19/113]
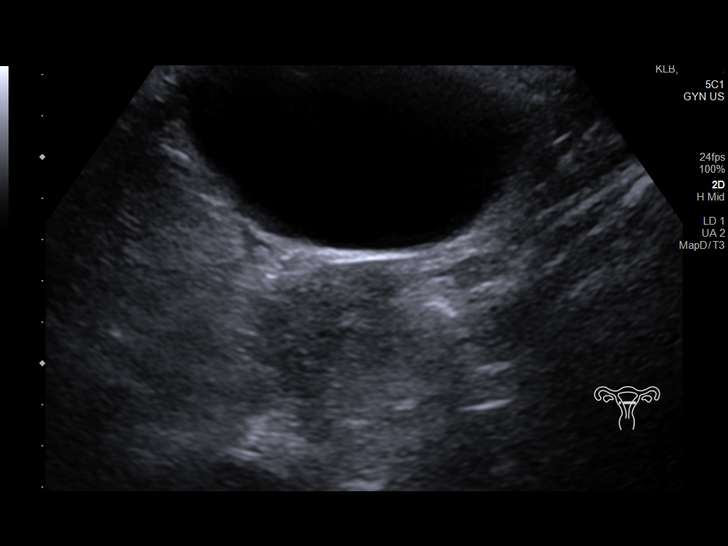
[im 29/113]
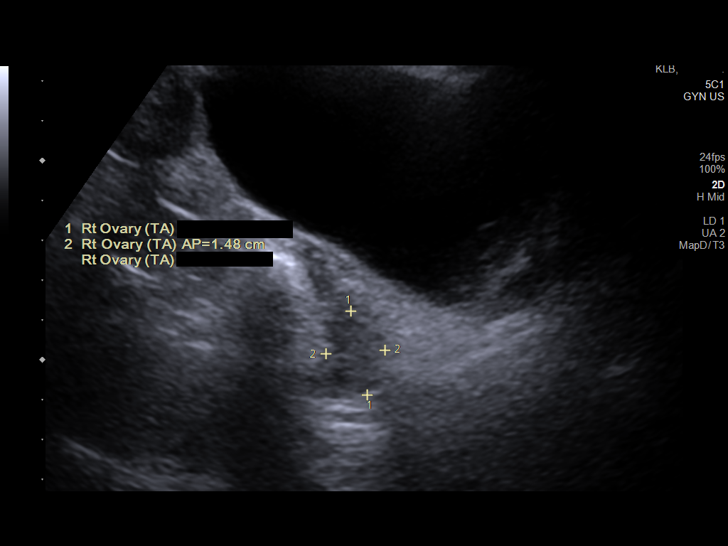
[im 38/113]
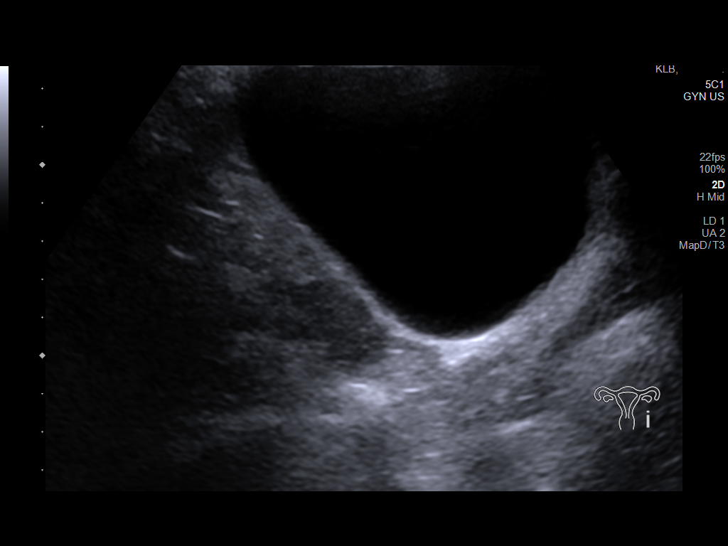
[im 47/113]
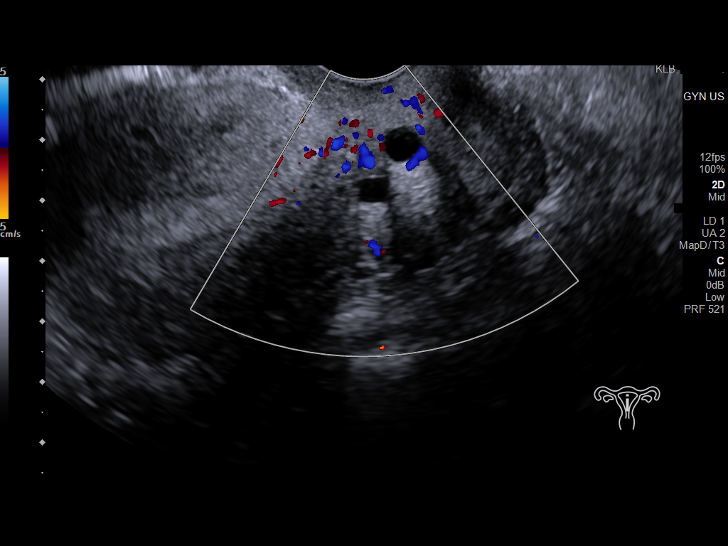
[im 57/113]
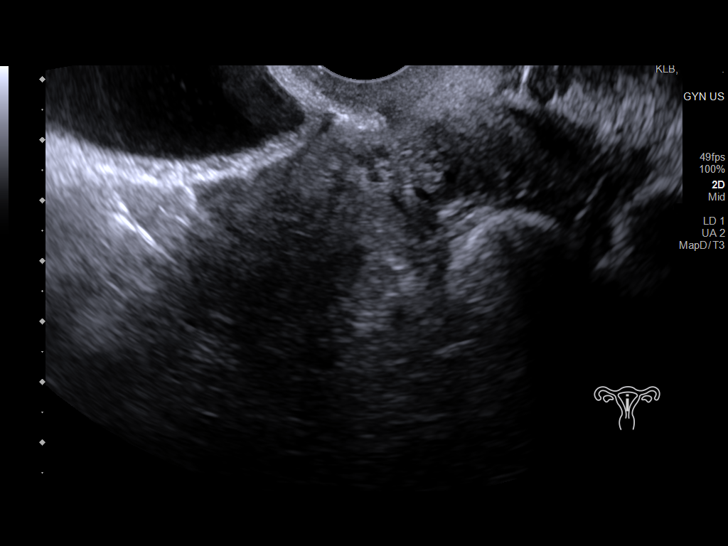
[im 66/113]
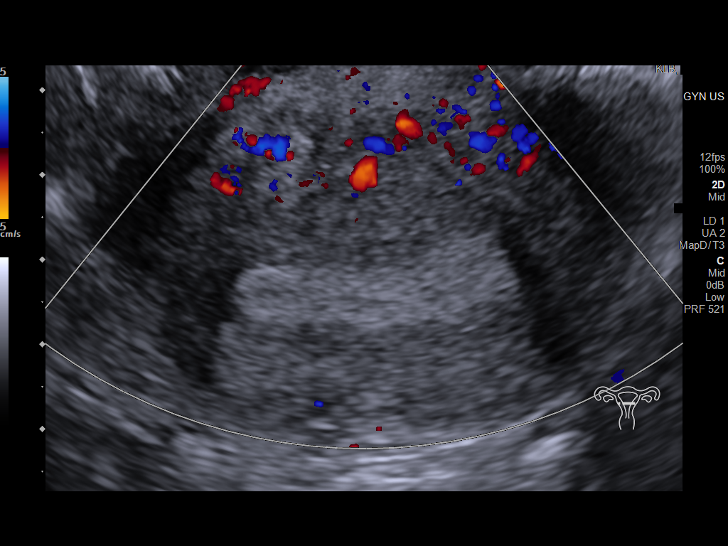
[im 75/113]
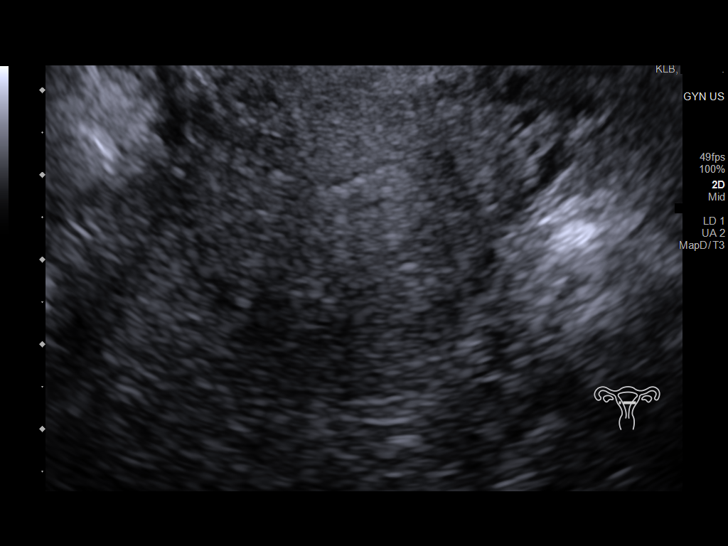
[im 85/113]
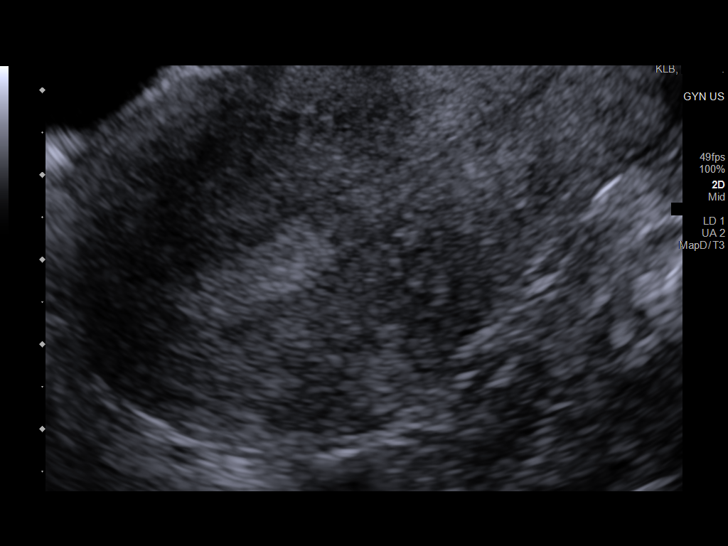
[im 94/113]
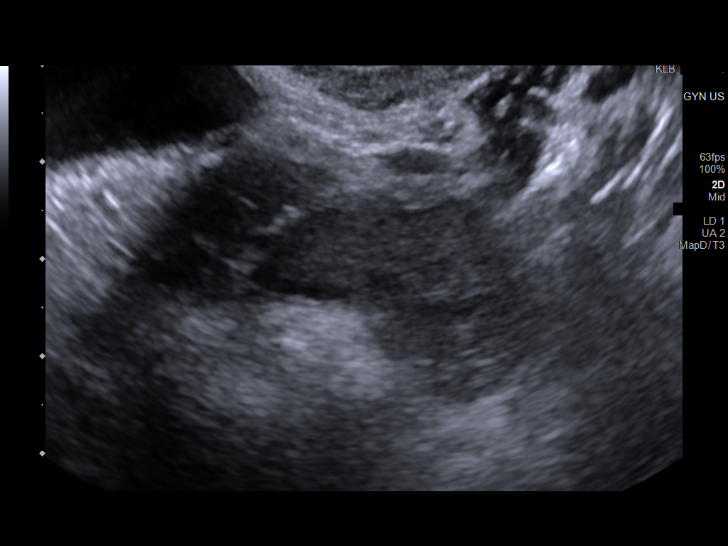
[im 103/113]
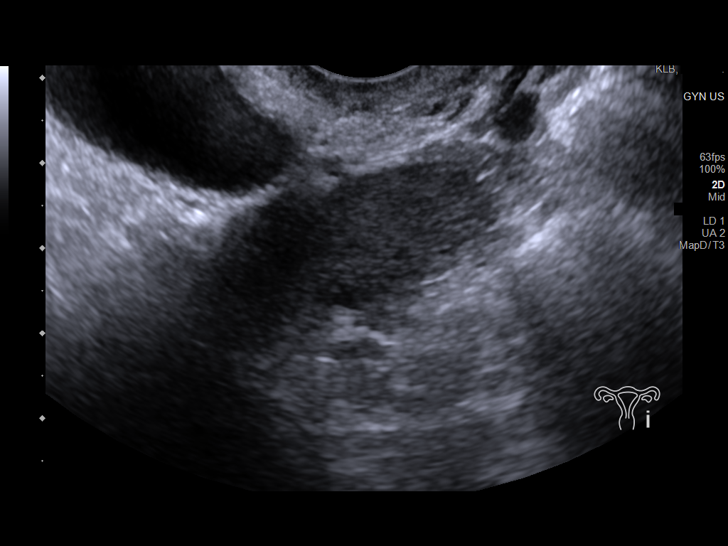
[im 113/113]
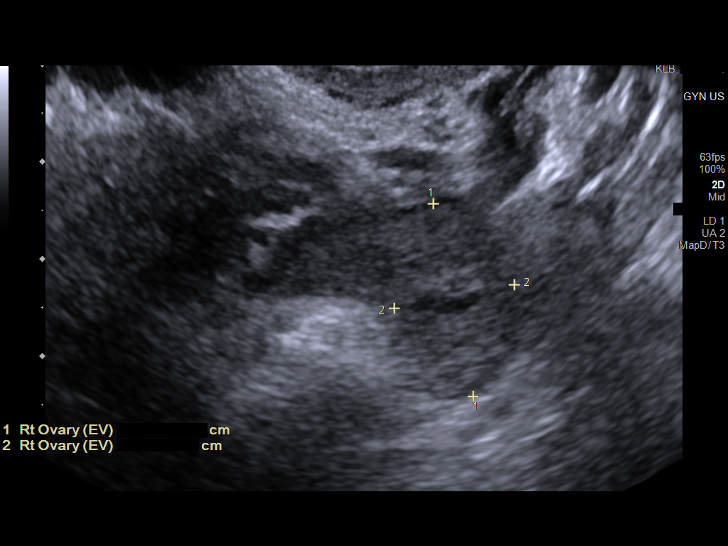

[13 of 25 positions shown; findings below may reference images not displayed]

FINDINGS: Uterus

Measurements: 9.0 x 4.2 x 5.2 cm = volume: 102 mL. Slightly
heterogeneous myometrium. No focal uterine mass.

Endometrium

Thickness: 9 mm. Subtle hypoechoic focus identified within the
endometrial complex at the mid uterus, may represent heterogeneous
myometrium or a subtle polyp 8 x 7 x 10 mm. No endometrial fluid.

Right ovary

Measurements: 2.2 x 1.5 x 1.8 cm = volume: 3.1 mL. Normal morphology
without mass

Left ovary

Measurements: 2.5 x 2.6 x 1.5 cm = volume: 5.1 mL. Normal morphology
without mass

Other findings

No free pelvic fluid.  No adnexal masses.
IMPRESSION: inhomogeneous myometrium; consider sonohysterogram for further
evaluation, prior to hysteroscopy or endometrial biopsy.

Remainder of exam unremarkable.

## 2020-10-21 ENCOUNTER — Telehealth: Payer: Self-pay | Admitting: Adult Health

## 2020-10-21 MED ORDER — PROMETHAZINE HCL 25 MG PO TABS: 25.0000 mg | ORAL_TABLET | Freq: Four times a day (QID) | ORAL | 1 refills | Status: DC | PRN

## 2020-10-21 NOTE — Telephone Encounter (Signed)
Pt called to check on her U/S results, states she's had terrible nausea for 3 weeks, culture at Urgent Care was negative Please advise & call pt - pt requesting a call back today, states she can't request meds for nausea from her PCP until she knows U/S results   Cell# don't work at her house  Cell# [phone], Home# [phone]

## 2020-10-21 NOTE — Addendum Note (Signed)
Addended by: Derrek Monaco A on: 10/21/2020 01:29 PM   Modules accepted: Orders

## 2020-10-21 NOTE — Telephone Encounter (Signed)
Pt aware that US showed polyp, she has appt Tuesday and she complains of nausea will rx phenergan

## 2020-10-25 ENCOUNTER — Ambulatory Visit: Payer: BC Managed Care – PPO | Admitting: Adult Health

## 2020-10-25 ENCOUNTER — Encounter: Payer: Self-pay | Admitting: Adult Health

## 2020-10-25 ENCOUNTER — Other Ambulatory Visit: Payer: Self-pay

## 2020-10-25 VITALS — BP 120/73 | Ht 61.0 in | Wt 156.0 lb

## 2020-10-25 DIAGNOSIS — N921 Excessive and frequent menstruation with irregular cycle: Secondary | ICD-10-CM

## 2020-10-25 DIAGNOSIS — N840 Polyp of corpus uteri: Secondary | ICD-10-CM | POA: Diagnosis not present

## 2020-10-25 DIAGNOSIS — R232 Flushing: Secondary | ICD-10-CM | POA: Diagnosis not present

## 2020-10-25 MED ORDER — MEGESTROL ACETATE 40 MG PO TABS: ORAL_TABLET | ORAL | 0 refills | Status: DC

## 2020-10-25 NOTE — Progress Notes (Signed)
  Subjective:     Patient ID: Teresa [name], female   DOB: May 13, 1976, 44 y.o.   MRN: [id number]  HPI [name] is a 44 year old white female,single, [id number] back in follow of having pelvic US at [facility] for menorrhagia and bloating. PCP is [place].   Review of Systems + heavy periods +bloating +hot flashes Reviewed past medical,surgical, social and family history. Reviewed medications and allergies.     Objective:   Physical Exam BP 120/73 (BP Location: Left Arm, Patient Position: Sitting, Cuff Size: Normal)   Ht 5\' 1"  (1.549 m)   Wt 156 lb (70.8 kg)   BMI 29.48 kg/m    Talk only: discussed [place] has 10 x 7 x 10 mm polyp mid uterus. Discussed having hysteroscopy with D& C and endometrial ablation and is she wants to do this. Will rx megace to stop period til then. Discussed with Dr Elonda Husky   Assessment:     1. Endometrial polyp Return in 2 weeks for pre op with Dr Elonda Husky   2. Menorrhagia with irregular cycle Review handout on ablation Will rx megace Meds ordered this encounter  Medications  . megestrol (MEGACE) 40 MG tablet    Sig: Take 2 daily    Dispense:  60 tablet    Refill:  0    Order Specific Question:   Supervising Provider    Answer:   EURE, LUTHER H [[id number]]    3. Hot flashes     Plan:     Return in 2 weeks with Dr Elonda Husky  To discuss surgery

## 2020-11-02 ENCOUNTER — Ambulatory Visit: Payer: BC Managed Care – PPO | Admitting: Adult Health

## 2020-11-04 ENCOUNTER — Telehealth: Payer: Self-pay | Admitting: Adult Health

## 2020-11-04 NOTE — Telephone Encounter (Signed)
Pt states that Anderson Malta put her on Megestrol to slow her period It worked for a couple days but is now bleeding heavy & in a lot of pain  Suggestions? Increase dose or change med?    Please advise & call pt - OK to leave a detailed voicemail due to pt is at work & may not be able to answer   [employer]

## 2020-11-04 NOTE — Telephone Encounter (Signed)
Left message @ 12:11 pm. How is pt taking Megace? [place]

## 2020-11-08 ENCOUNTER — Ambulatory Visit (INDEPENDENT_AMBULATORY_CARE_PROVIDER_SITE_OTHER): Payer: BC Managed Care – PPO | Admitting: Obstetrics & Gynecology

## 2020-11-08 ENCOUNTER — Encounter: Payer: Self-pay | Admitting: Obstetrics & Gynecology

## 2020-11-08 VITALS — BP 126/81 | HR 80 | Wt 152.0 lb

## 2020-11-08 DIAGNOSIS — N840 Polyp of corpus uteri: Secondary | ICD-10-CM | POA: Diagnosis not present

## 2020-11-08 DIAGNOSIS — N921 Excessive and frequent menstruation with irregular cycle: Secondary | ICD-10-CM

## 2020-11-08 NOTE — Progress Notes (Signed)
Preoperative History and Physical  Teresa [name] is a 44 y.o. [phone] with Patient's last menstrual period was 11/03/2020 (exact date). admitted for a hysteroscopy uterine curettage Minerva endometrial ablation.  No dyspareunia no chronic pain  PMH:    Past Medical History:  Diagnosis Date  . Anxiety   . Arthritis    knees   . Asthma due to seasonal allergies   . Bipolar disorder ([place])   . Cancer (HCC)    hx of cervical cancer- laser surgery done   . Carpal tunnel syndrome    bilateral   . GERD (gastroesophageal reflux disease)   . OCD (obsessive compulsive disorder)    2013  . PTSD (post-traumatic stress disorder)    2013  . Shortness of breath dyspnea    allergy related   . Trigger finger    bilateral middle fingers     PSH:     Past Surgical History:  Procedure Laterality Date  . CARDIAC CATHETERIZATION    . CHOLECYSTECTOMY N/A 05/29/2016   Procedure: LAPAROSCOPIC CHOLECYSTECTOMY WITH INTRAOPERATIVE CHOLANGIOGRAM;  Surgeon: Jackolyn Confer, MD;  Location: [facility];  Service: General;  Laterality: N/A;  . laser on cervix    . LUMBAR LAMINECTOMY Right 08/15/2017   Procedure: RIGHT SIDED LUMBAR FOUR-FIVE  MICRODISCECTOMY.;  Surgeon: Phylliss Bob, MD;  Location: [place];  Service: Orthopedics;  Laterality: Right;  . TUBAL LIGATION    . WISDOM TOOTH EXTRACTION      POb/GynH:      OB History    Gravida  6   Para  4   Term  4   Preterm      AB  2   Living  4     SAB  1   TAB  1   Ectopic      Multiple      Live Births              SH:   Social History   Tobacco Use  . Smoking status: Current Every Day Smoker    Packs/day: 1.00    Years: 24.00    Pack years: 24.00    Types: Cigarettes  . Smokeless tobacco: Never Used  Vaping Use  . Vaping Use: Never used  Substance Use Topics  . Alcohol use: Yes  . Drug use: Yes    Types: Marijuana    FH:    Family History  Problem Relation Age of Onset  . Hypertension Maternal Grandfather   . COPD  Mother      Allergies:  Allergies  Allergen Reactions  . Latex Other (See Comments)    Skin blisters, becomes red and swollen  . Vicodin [Hydrocodone-Acetaminophen] Itching    Medications:       Current Outpatient Medications:  .  megestrol (MEGACE) 40 MG tablet, Take 2 daily, Disp: 60 tablet, Rfl: 0 .  promethazine (PHENERGAN) 25 MG tablet, Take 1 tablet (25 mg total) by mouth every 6 (six) hours as needed for nausea or vomiting., Disp: 30 tablet, Rfl: 1 .  tiZANidine (ZANAFLEX) 4 MG tablet, Take 4 mg by mouth at bedtime., Disp: , Rfl:  .  zolpidem (AMBIEN) 10 MG tablet, Take 10 mg by mouth at bedtime as needed for sleep., Disp: , Rfl:   Review of Systems:   Review of Systems  Constitutional: Negative for fever, chills, weight loss, malaise/fatigue and diaphoresis.  HENT: Negative for hearing loss, ear pain, nosebleeds, congestion, sore throat, neck pain, tinnitus and ear discharge.  Eyes: Negative for blurred vision, double vision, photophobia, pain, discharge and redness.  Respiratory: Negative for cough, hemoptysis, sputum production, shortness of breath, wheezing and stridor.   Cardiovascular: Negative for chest pain, palpitations, orthopnea, claudication, leg swelling and PND.  Gastrointestinal: Positive for abdominal pain. Negative for heartburn, nausea, vomiting, diarrhea, constipation, blood in stool and melena.  Genitourinary: Negative for dysuria, urgency, frequency, hematuria and flank pain.  Musculoskeletal: Negative for myalgias, back pain, joint pain and falls.  Skin: Negative for itching and rash.  Neurological: Negative for dizziness, tingling, tremors, sensory change, speech change, focal weakness, seizures, loss of consciousness, weakness and headaches.  Endo/Heme/Allergies: Negative for environmental allergies and polydipsia. Does not bruise/bleed easily.  Psychiatric/Behavioral: Negative for depression, suicidal ideas, hallucinations, memory loss and substance  abuse. The patient is not nervous/anxious and does not have insomnia.      PHYSICAL EXAM:  Blood pressure 126/81, pulse 80, weight 152 lb (68.9 kg), last menstrual period 11/03/2020.    Vitals reviewed. Constitutional: She is oriented to person, place, and time. She appears well-developed and well-nourished.  HENT:  Head: Normocephalic and atraumatic.  Right Ear: External ear normal.  Left Ear: External ear normal.  Nose: Nose normal.  Mouth/Throat: Oropharynx is clear and moist.  Eyes: Conjunctivae and EOM are normal. Pupils are equal, round, and reactive to light. Right eye exhibits no discharge. Left eye exhibits no discharge. No scleral icterus.  Neck: Normal range of motion. Neck supple. No tracheal deviation present. No thyromegaly present.  Cardiovascular: Normal rate, regular rhythm, normal heart sounds and intact distal pulses.  Exam reveals no gallop and no friction rub.   No murmur heard. Respiratory: Effort normal and breath sounds normal. No respiratory distress. She has no wheezes. She has no rales. She exhibits no tenderness.  GI: Soft. Bowel sounds are normal. She exhibits no distension and no mass. There is tenderness. There is no rebound and no guarding.  Genitourinary:       Vulva is normal without lesions Vagina is pink moist without discharge Cervix normal in appearance and pap is normal Uterus is normal size, contour, position, consistency, mobility, non-tender Adnexa is negative with normal sized ovaries by sonogram  Musculoskeletal: Normal range of motion. She exhibits no edema and no tenderness.  Neurological: She is alert and oriented to person, place, and time. She has normal reflexes. She displays normal reflexes. No cranial nerve deficit. She exhibits normal muscle tone. Coordination normal.  Skin: Skin is warm and dry. No rash noted. No erythema. No pallor.  Psychiatric: She has a normal mood and affect. Her behavior is normal. Judgment and thought  content normal.    Labs: No results found for this or any previous visit (from the past 336 hour(s)).  EKG: Orders placed or performed during the hospital encounter of 08/15/17  . EKG 12-Lead  . EKG 12-Lead    Imaging Studies: US PELVIC COMPLETE WITH TRANSVAGINAL  Result Date: 10/18/2020 CLINICAL DATA:  Menorrhagia with irregular cycle; unknown LMP EXAM: TRANSABDOMINAL AND TRANSVAGINAL ULTRASOUND OF PELVIS TECHNIQUE: Both transabdominal and transvaginal ultrasound examinations of the pelvis were performed. Transabdominal technique was performed for global imaging of the pelvis including uterus, ovaries, adnexal regions, and pelvic cul-de-sac. It was necessary to proceed with endovaginal exam following the transabdominal exam to visualize the lower uterine segment and ovaries. COMPARISON:  09/23/2009 FINDINGS: Uterus Measurements: 9.0 x 4.2 x 5.2 cm = volume: 102 mL. Slightly heterogeneous myometrium. No focal uterine mass. Endometrium Thickness: 9 mm. Subtle hypoechoic focus  identified within the endometrial complex at the mid uterus, may represent heterogeneous myometrium or a subtle polyp 8 x 7 x 10 mm. No endometrial fluid. Right ovary Measurements: 2.2 x 1.5 x 1.8 cm = volume: 3.1 mL. Normal morphology without mass Left ovary Measurements: 2.5 x 2.6 x 1.5 cm = volume: 5.1 mL. Normal morphology without mass Other findings No free pelvic fluid.  No adnexal masses. IMPRESSION: Questionable subtle polyp at mid uterus 10 x 7 x 10 mm versus inhomogeneous myometrium; consider sonohysterogram for further evaluation, prior to hysteroscopy or endometrial biopsy. Remainder of exam unremarkable. Electronically Signed   By: Lavonia Dana M.D.   On: 10/18/2020 16:25      Assessment: Menorrhagia Endometrial polyp  Plan: Hysteroscopy uterine curettage Minerva endometrial ablation, tentatively 11/30/20  Mertie Clause Dionisios Ricci 11/08/2020 3:18 PM

## 2020-11-08 NOTE — Telephone Encounter (Signed)
Pt was seen in office by Dr. Elonda Husky on 11/08/20. Closing encounter. [place]

## 2020-11-08 NOTE — Telephone Encounter (Signed)
Left message @ 12:05 pm. JSY

## 2020-11-14 ENCOUNTER — Encounter: Payer: Self-pay | Admitting: *Deleted

## 2020-11-22 ENCOUNTER — Encounter: Payer: Self-pay | Admitting: *Deleted

## 2020-11-22 ENCOUNTER — Telehealth: Payer: Self-pay | Admitting: *Deleted

## 2020-11-22 NOTE — Patient Instructions (Signed)
Teresa [name]  11/22/2020     @PREFPERIOPPHARMACY @   Your procedure is scheduled on  11/30/2020.  Report to [facility] at  [email or web address].  Call this number if you have problems the morning of surgery:  [phone]   Remember:  Do not eat or drink after midnight.                         Take these medicines the morning of surgery with A SIP OF WATER phenergan(if needed)    Do not wear jewelry, make-up or nail polish.  Do not wear lotions, powders, or perfumes. Please wear deodorant and brush your teeth.  Do not shave 48 hours prior to surgery.  Men may shave face and neck.  Do not bring valuables to the hospital.  [facility] is not responsible for any belongings or valuables.  Contacts, dentures or bridgework may not be worn into surgery.  Leave your suitcase in the car.  After surgery it may be brought to your room.  For patients admitted to the hospital, discharge time will be determined by your treatment team.  Patients discharged the day of surgery will not be allowed to drive home.   Name and phone number of your driver:   family Special instructions:  DO NOT smoke the morning of your procedure.  Please read over the following fact sheets that you were given. Anesthesia Post-op Instructions and Care and Recovery After Surgery       Dilation and Curettage or Vacuum Curettage, Care After These instructions give you information about caring for yourself after your procedure. Your doctor may also give you more specific instructions. Call your doctor if you have any problems or questions after your procedure. Follow these instructions at home: Activity  Do not drive or use heavy machinery while taking prescription pain medicine.  For 24 hours after your procedure, avoid driving.  Take short walks often, followed by rest periods. Ask your doctor what activities are safe for you. After one or two days, you may be able to return to your normal  activities.  Do not lift anything that is heavier than 10 lb (4.5 kg) until your doctor approves.  For at least 2 weeks, or as long as told by your doctor: ? Do not douche. ? Do not use tampons. ? Do not have sex. General instructions   Take over-the-counter and prescription medicines only as told by your doctor. This is very important if you take blood thinning medicine.  Do not take baths, swim, or use a hot tub until your doctor approves. Take showers instead of baths.  Wear compression stockings as told by your doctor.  It is up to you to get the results of your procedure. Ask your doctor when your results will be ready.  Keep all follow-up visits as told by your doctor. This is important. Contact a doctor if:  You have very bad cramps that get worse or do not get better with medicine.  You have very bad pain in your belly (abdomen).  You cannot drink fluids without throwing up (vomiting).  You get pain in a different part of the area between your belly and thighs (pelvis).  You have bad-smelling discharge from your vagina.  You have a rash. Get help right away if:  You are bleeding a lot from your vagina. A lot of bleeding means soaking more than one  sanitary pad in an hour, for 2 hours in a row.  You have clumps of blood (blood clots) coming from your vagina.  You have a fever or chills.  Your belly feels very tender or hard.  You have chest pain.  You have trouble breathing.  You cough up blood.  You feel dizzy.  You feel light-headed.  You pass out (faint).  You have pain in your neck or shoulder area. Summary  Take short walks often, followed by rest periods. Ask your doctor what activities are safe for you. After one or two days, you may be able to return to your normal activities.  Do not lift anything that is heavier than 10 lb (4.5 kg) until your doctor approves.  Do not take baths, swim, or use a hot tub until your doctor approves. Take  showers instead of baths.  Contact your doctor if you have any symptoms of infection, like bad-smelling discharge from your vagina. This information is not intended to replace advice given to you by your health care provider. Make sure you discuss any questions you have with your health care provider. Document Revised: 11/29/2017 Document Reviewed: 09/03/2016 Elsevier Patient Education  2020 [place] Anesthesia, Adult, Care After This sheet gives you information about how to care for yourself after your procedure. Your health care provider may also give you more specific instructions. If you have problems or questions, contact your health care provider. What can I expect after the procedure? After the procedure, the following side effects are common:  Pain or discomfort at the IV site.  Nausea.  Vomiting.  Sore throat.  Trouble concentrating.  Feeling cold or chills.  Weak or tired.  Sleepiness and fatigue.  Soreness and body aches. These side effects can affect parts of the body that were not involved in surgery. Follow these instructions at home:  For at least 24 hours after the procedure:  Have a responsible adult stay with you. It is important to have someone help care for you until you are awake and alert.  Rest as needed.  Do not: ? Participate in activities in which you could fall or become injured. ? Drive. ? Use heavy machinery. ? Drink alcohol. ? Take sleeping pills or medicines that cause drowsiness. ? Make important decisions or sign legal documents. ? Take care of children on your own. Eating and drinking  Follow any instructions from your health care provider about eating or drinking restrictions.  When you feel hungry, start by eating small amounts of foods that are soft and easy to digest (bland), such as toast. Gradually return to your regular diet.  Drink enough fluid to keep your urine pale yellow.  If you vomit, rehydrate by  drinking water, juice, or clear broth. General instructions  If you have sleep apnea, surgery and certain medicines can increase your risk for breathing problems. Follow instructions from your health care provider about wearing your sleep device: ? Anytime you are sleeping, including during daytime naps. ? While taking prescription pain medicines, sleeping medicines, or medicines that make you drowsy.  Return to your normal activities as told by your health care provider. Ask your health care provider what activities are safe for you.  Take over-the-counter and prescription medicines only as told by your health care provider.  If you smoke, do not smoke without supervision.  Keep all follow-up visits as told by your health care provider. This is important. Contact a health care provider if:  You  have nausea or vomiting that does not get better with medicine.  You cannot eat or drink without vomiting.  You have pain that does not get better with medicine.  You are unable to pass urine.  You develop a skin rash.  You have a fever.  You have redness around your IV site that gets worse. Get help right away if:  You have difficulty breathing.  You have chest pain.  You have blood in your urine or stool, or you vomit blood. Summary  After the procedure, it is common to have a sore throat or nausea. It is also common to feel tired.  Have a responsible adult stay with you for the first 24 hours after general anesthesia. It is important to have someone help care for you until you are awake and alert.  When you feel hungry, start by eating small amounts of foods that are soft and easy to digest (bland), such as toast. Gradually return to your regular diet.  Drink enough fluid to keep your urine pale yellow.  Return to your normal activities as told by your health care provider. Ask your health care provider what activities are safe for you. This information is not intended to  replace advice given to you by your health care provider. Make sure you discuss any questions you have with your health care provider. Document Revised: 12/20/2017 Document Reviewed: 08/02/2017 Elsevier Patient Education  [place]. How to Use Chlorhexidine for Bathing Chlorhexidine gluconate (CHG) is a germ-killing (antiseptic) solution that is used to clean the skin. It can get rid of the bacteria that normally live on the skin and can keep them away for about 24 hours. To clean your skin with CHG, you may be given:  A CHG solution to use in the shower or as part of a sponge bath.  A prepackaged cloth that contains CHG. Cleaning your skin with CHG may help lower the risk for infection:  While you are staying in the intensive care unit of the hospital.  If you have a vascular access, such as a central line, to provide short-term or long-term access to your veins.  If you have a catheter to drain urine from your bladder.  If you are on a ventilator. A ventilator is a machine that helps you breathe by moving air in and out of your lungs.  After surgery. What are the risks? Risks of using CHG include:  A skin reaction.  Hearing loss, if CHG gets in your ears.  Eye injury, if CHG gets in your eyes and is not rinsed out.  The CHG product catching fire. Make sure that you avoid smoking and flames after applying CHG to your skin. Do not use CHG:  If you have a chlorhexidine allergy or have previously reacted to chlorhexidine.  On babies younger than 52 months of age. How to use CHG solution  Use CHG only as told by your health care provider, and follow the instructions on the label.  Use the full amount of CHG as directed. Usually, this is one bottle. During a shower Follow these steps when using CHG solution during a shower (unless your health care provider gives you different instructions): 1. Start the shower. 2. Use your normal soap and shampoo to wash your face and  hair. 3. Turn off the shower or move out of the shower stream. 4. Pour the CHG onto a clean washcloth. Do not use any type of brush or rough-edged sponge. 5. Starting  at your neck, lather your body down to your toes. Make sure you follow these instructions: ? If you will be having surgery, pay special attention to the part of your body where you will be having surgery. Scrub this area for at least 1 minute. ? Do not use CHG on your head or face. If the solution gets into your ears or eyes, rinse them well with water. ? Avoid your genital area. ? Avoid any areas of skin that have broken skin, cuts, or scrapes. ? Scrub your back and under your arms. Make sure to wash skin folds. 6. Let the lather sit on your skin for 1-2 minutes or as long as told by your health care provider. 7. Thoroughly rinse your entire body in the shower. Make sure that all body creases and crevices are rinsed well. 8. Dry off with a clean towel. Do not put any substances on your body afterward--such as powder, lotion, or perfume--unless you are told to do so by your health care provider. Only use lotions that are recommended by the manufacturer. 9. Put on clean clothes or pajamas. 10. If it is the night before your surgery, sleep in clean sheets.  During a sponge bath Follow these steps when using CHG solution during a sponge bath (unless your health care provider gives you different instructions): 1. Use your normal soap and shampoo to wash your face and hair. 2. Pour the CHG onto a clean washcloth. 3. Starting at your neck, lather your body down to your toes. Make sure you follow these instructions: ? If you will be having surgery, pay special attention to the part of your body where you will be having surgery. Scrub this area for at least 1 minute. ? Do not use CHG on your head or face. If the solution gets into your ears or eyes, rinse them well with water. ? Avoid your genital area. ? Avoid any areas of skin that  have broken skin, cuts, or scrapes. ? Scrub your back and under your arms. Make sure to wash skin folds. 4. Let the lather sit on your skin for 1-2 minutes or as long as told by your health care provider. 5. Using a different clean, wet washcloth, thoroughly rinse your entire body. Make sure that all body creases and crevices are rinsed well. 6. Dry off with a clean towel. Do not put any substances on your body afterward--such as powder, lotion, or perfume--unless you are told to do so by your health care provider. Only use lotions that are recommended by the manufacturer. 7. Put on clean clothes or pajamas. 8. If it is the night before your surgery, sleep in clean sheets. How to use CHG prepackaged cloths  Only use CHG cloths as told by your health care provider, and follow the instructions on the label.  Use the CHG cloth on clean, dry skin.  Do not use the CHG cloth on your head or face unless your health care provider tells you to.  When washing with the CHG cloth: ? Avoid your genital area. ? Avoid any areas of skin that have broken skin, cuts, or scrapes. Before surgery Follow these steps when using a CHG cloth to clean before surgery (unless your health care provider gives you different instructions): 1. Using the CHG cloth, vigorously scrub the part of your body where you will be having surgery. Scrub using a back-and-forth motion for 3 minutes. The area on your body should be completely wet with CHG when  you are done scrubbing. 2. Do not rinse. Discard the cloth and let the area air-dry. Do not put any substances on the area afterward, such as powder, lotion, or perfume. 3. Put on clean clothes or pajamas. 4. If it is the night before your surgery, sleep in clean sheets.  For general bathing Follow these steps when using CHG cloths for general bathing (unless your health care provider gives you different instructions). 1. Use a separate CHG cloth for each area of your body. Make  sure you wash between any folds of skin and between your fingers and toes. Wash your body in the following order, switching to a new cloth after each step: ? The front of your neck, shoulders, and chest. ? Both of your arms, under your arms, and your hands. ? Your stomach and groin area, avoiding the genitals. ? Your right leg and foot. ? Your left leg and foot. ? The back of your neck, your back, and your buttocks. 2. Do not rinse. Discard the cloth and let the area air-dry. Do not put any substances on your body afterward--such as powder, lotion, or perfume--unless you are told to do so by your health care provider. Only use lotions that are recommended by the manufacturer. 3. Put on clean clothes or pajamas. Contact a health care provider if:  Your skin gets irritated after scrubbing.  You have questions about using your solution or cloth. Get help right away if:  Your eyes become very red or swollen.  Your eyes itch badly.  Your skin itches badly and is red or swollen.  Your hearing changes.  You have trouble seeing.  You have swelling or tingling in your mouth or throat.  You have trouble breathing.  You swallow any chlorhexidine. Summary  Chlorhexidine gluconate (CHG) is a germ-killing (antiseptic) solution that is used to clean the skin. Cleaning your skin with CHG may help to lower your risk for infection.  You may be given CHG to use for bathing. It may be in a bottle or in a prepackaged cloth to use on your skin. Carefully follow your health care provider's instructions and the instructions on the product label.  Do not use CHG if you have a chlorhexidine allergy.  Contact your health care provider if your skin gets irritated after scrubbing. This information is not intended to replace advice given to you by your health care provider. Make sure you discuss any questions you have with your health care provider. Document Revised: 03/05/2019 Document Reviewed:  11/14/2017 Elsevier Patient Education  [place].

## 2020-11-22 NOTE — Telephone Encounter (Signed)
Patient states she was sent home today and was told she needed to start her leave today for her surgery.  Needs a note sent to [facility]ter fax [phone].

## 2020-11-23 ENCOUNTER — Other Ambulatory Visit: Payer: Self-pay | Admitting: Obstetrics & Gynecology

## 2020-11-28 ENCOUNTER — Other Ambulatory Visit: Payer: Self-pay

## 2020-11-28 ENCOUNTER — Other Ambulatory Visit (HOSPITAL_COMMUNITY)
Admission: RE | Admit: 2020-11-28 | Discharge: 2020-11-28 | Disposition: A | Discharge status: Home / Self Care | Payer: BC Managed Care – PPO | Source: Ambulatory Visit | Attending: Obstetrics & Gynecology | Admitting: Obstetrics & Gynecology

## 2020-11-28 ENCOUNTER — Encounter (HOSPITAL_COMMUNITY)
Admission: RE | Admit: 2020-11-28 | Discharge: 2020-11-28 | Disposition: A | Discharge status: Home / Self Care | Payer: BC Managed Care – PPO | Source: Ambulatory Visit | Attending: Obstetrics & Gynecology | Admitting: Obstetrics & Gynecology

## 2020-11-28 DIAGNOSIS — Z885 Allergy status to narcotic agent status: Secondary | ICD-10-CM | POA: Diagnosis not present

## 2020-11-28 DIAGNOSIS — Z20822 Contact with and (suspected) exposure to covid-19: Secondary | ICD-10-CM | POA: Insufficient documentation

## 2020-11-28 DIAGNOSIS — N920 Excessive and frequent menstruation with regular cycle: Secondary | ICD-10-CM | POA: Diagnosis not present

## 2020-11-28 DIAGNOSIS — Z91040 Latex allergy status: Secondary | ICD-10-CM | POA: Diagnosis not present

## 2020-11-28 DIAGNOSIS — N840 Polyp of corpus uteri: Secondary | ICD-10-CM | POA: Diagnosis not present

## 2020-11-28 DIAGNOSIS — Z79818 Long term (current) use of other agents affecting estrogen receptors and estrogen levels: Secondary | ICD-10-CM | POA: Diagnosis not present

## 2020-11-28 DIAGNOSIS — Z01812 Encounter for preprocedural laboratory examination: Secondary | ICD-10-CM | POA: Insufficient documentation

## 2020-11-28 DIAGNOSIS — F17210 Nicotine dependence, cigarettes, uncomplicated: Secondary | ICD-10-CM | POA: Diagnosis not present

## 2020-11-28 DIAGNOSIS — D250 Submucous leiomyoma of uterus: Secondary | ICD-10-CM | POA: Diagnosis not present

## 2020-11-28 DIAGNOSIS — Z79899 Other long term (current) drug therapy: Secondary | ICD-10-CM | POA: Diagnosis not present

## 2020-11-28 LAB — HCG, QUANTITATIVE, PREGNANCY: hCG, Beta Chain, Quant, S: <1 m[IU]/mL (ref <5)

## 2020-11-28 LAB — COMPREHENSIVE METABOLIC PANEL
ALT: 17 U/L (ref 0–44)
AST: 13 U/L — ABNORMAL LOW (ref 15–41)
Albumin: 4.1 g/dL (ref 3.5–5.0)
Alkaline Phosphatase: 36 U/L — ABNORMAL LOW (ref 38–126)
Anion gap: 8 (ref 5–15)
BUN: 9 mg/dL (ref 6–20)
CO2: 25 mmol/L (ref 22–32)
Calcium: 9 mg/dL (ref 8.9–10.3)
Chloride: 104 mmol/L (ref 98–111)
Creatinine, Ser: 0.58 mg/dL (ref 0.44–1.00)
GFR, Estimated: >60 mL/min (ref >60)
Glucose, Bld: 82 mg/dL (ref 70–99)
Potassium: 4 mmol/L (ref 3.5–5.1)
Sodium: 137 mmol/L (ref 135–145)
Total Bilirubin: 0.5 mg/dL (ref 0.3–1.2)
Total Protein: 7.1 g/dL (ref 6.5–8.1)

## 2020-11-28 LAB — URINALYSIS, ROUTINE W REFLEX MICROSCOPIC
Bilirubin Urine: NEGATIVE
Glucose, UA: NEGATIVE mg/dL
Hgb urine dipstick: NEGATIVE
Ketones, ur: NEGATIVE mg/dL
Leukocytes,Ua: NEGATIVE
Nitrite: NEGATIVE
Protein, ur: NEGATIVE mg/dL
Specific Gravity, Urine: 1.012 (ref 1.005–1.030)
pH: 7 (ref 5.0–8.0)

## 2020-11-28 LAB — CBC
HCT: 42 % (ref 36.0–46.0)
Hemoglobin: 13.6 g/dL (ref 12.0–15.0)
MCH: 30.2 pg (ref 26.0–34.0)
MCHC: 32.4 g/dL (ref 30.0–36.0)
MCV: 93.3 fL (ref 80.0–100.0)
Platelets: 353 10*3/uL (ref 150–400)
RBC: 4.5 MIL/uL (ref 3.87–5.11)
RDW: 12.8 % (ref 11.5–15.5)
WBC: 10.2 10*3/uL (ref 4.0–10.5)
nRBC: 0 % (ref 0.0–0.2)

## 2020-11-28 LAB — RAPID HIV SCREEN (HIV 1/2 AB+AG)
HIV 1/2 Antibodies: NONREACTIVE
HIV-1 P24 Antigen - HIV24: NONREACTIVE

## 2020-11-29 LAB — SARS CORONAVIRUS 2 (TAT 6-24 HRS): SARS Coronavirus 2: NEGATIVE

## 2020-11-30 ENCOUNTER — Ambulatory Visit (HOSPITAL_COMMUNITY)
Admission: RE | Admit: 2020-11-30 | Discharge: 2020-11-30 | Disposition: A | Discharge status: Home / Self Care | Payer: BC Managed Care – PPO | Attending: Obstetrics & Gynecology | Admitting: Obstetrics & Gynecology

## 2020-11-30 ENCOUNTER — Ambulatory Visit (HOSPITAL_COMMUNITY): Payer: BC Managed Care – PPO | Admitting: Anesthesiology

## 2020-11-30 ENCOUNTER — Encounter (HOSPITAL_COMMUNITY): Payer: Self-pay | Admitting: Obstetrics & Gynecology

## 2020-11-30 ENCOUNTER — Encounter (HOSPITAL_COMMUNITY)
Admission: RE | Disposition: A | Discharge status: Home / Self Care | Payer: Self-pay | Source: Home / Self Care | Attending: Obstetrics & Gynecology

## 2020-11-30 DIAGNOSIS — D250 Submucous leiomyoma of uterus: Secondary | ICD-10-CM | POA: Diagnosis not present

## 2020-11-30 DIAGNOSIS — Z79899 Other long term (current) drug therapy: Secondary | ICD-10-CM | POA: Insufficient documentation

## 2020-11-30 DIAGNOSIS — Z79818 Long term (current) use of other agents affecting estrogen receptors and estrogen levels: Secondary | ICD-10-CM | POA: Diagnosis not present

## 2020-11-30 DIAGNOSIS — F17210 Nicotine dependence, cigarettes, uncomplicated: Secondary | ICD-10-CM | POA: Insufficient documentation

## 2020-11-30 DIAGNOSIS — N920 Excessive and frequent menstruation with regular cycle: Secondary | ICD-10-CM | POA: Diagnosis not present

## 2020-11-30 DIAGNOSIS — Z20822 Contact with and (suspected) exposure to covid-19: Secondary | ICD-10-CM | POA: Diagnosis not present

## 2020-11-30 DIAGNOSIS — Z91040 Latex allergy status: Secondary | ICD-10-CM | POA: Insufficient documentation

## 2020-11-30 DIAGNOSIS — N840 Polyp of corpus uteri: Secondary | ICD-10-CM | POA: Diagnosis not present

## 2020-11-30 DIAGNOSIS — Z885 Allergy status to narcotic agent status: Secondary | ICD-10-CM | POA: Insufficient documentation

## 2020-11-30 HISTORY — PX: DILATATION AND CURETTAGE/HYSTEROSCOPY WITH MINERVA: SHX6851

## 2020-11-30 SURGERY — DILATATION AND CURETTAGE/HYSTEROSCOPY WITH MINERVA
Anesthesia: General

## 2020-11-30 MED ORDER — MIDAZOLAM HCL 2 MG/2ML IJ SOLN
INTRAMUSCULAR | Status: AC
  Filled 2020-11-30: qty 2

## 2020-11-30 MED ORDER — DEXAMETHASONE SODIUM PHOSPHATE 10 MG/ML IJ SOLN
INTRAMUSCULAR | Status: AC
  Filled 2020-11-30: qty 1

## 2020-11-30 MED ORDER — ONDANSETRON 8 MG PO TBDP: 8.0000 mg | ORAL_TABLET | Freq: Three times a day (TID) | ORAL | 0 refills | Status: AC | PRN

## 2020-11-30 MED ORDER — MIDAZOLAM HCL 2 MG/2ML IJ SOLN
INTRAMUSCULAR | Status: DC | PRN
  Administered 2020-11-30: 2 mg via INTRAVENOUS

## 2020-11-30 MED ORDER — PROPOFOL 10 MG/ML IV BOLUS
INTRAVENOUS | Status: AC
  Filled 2020-11-30: qty 20

## 2020-11-30 MED ORDER — LIDOCAINE HCL (PF) 2 % IJ SOLN
INTRAMUSCULAR | Status: AC
  Filled 2020-11-30: qty 5

## 2020-11-30 MED ORDER — 0.9 % SODIUM CHLORIDE (POUR BTL) OPTIME
TOPICAL | Status: DC | PRN
  Administered 2020-11-30: 1000 mL

## 2020-11-30 MED ORDER — KETOROLAC TROMETHAMINE 10 MG PO TABS: 10.0000 mg | ORAL_TABLET | Freq: Three times a day (TID) | ORAL | 0 refills | Status: DC | PRN

## 2020-11-30 MED ORDER — DEXMEDETOMIDINE (PRECEDEX) IN NS 20 MCG/5ML (4 MCG/ML) IV SYRINGE
PREFILLED_SYRINGE | INTRAVENOUS | Status: DC | PRN
  Administered 2020-11-30 (×2): 4 ug via INTRAVENOUS
  Administered 2020-11-30: 8 ug via INTRAVENOUS

## 2020-11-30 MED ORDER — HYDROMORPHONE HCL 1 MG/ML IJ SOLN
0.2500 mg | INTRAMUSCULAR | Status: DC | PRN
  Administered 2020-11-30 (×2): 0.5 mg via INTRAVENOUS
  Filled 2020-11-30: qty 0.5

## 2020-11-30 MED ORDER — FENTANYL CITRATE (PF) 250 MCG/5ML IJ SOLN
INTRAMUSCULAR | Status: DC | PRN
  Administered 2020-11-30: 50 ug via INTRAVENOUS
  Administered 2020-11-30: 25 ug via INTRAVENOUS
  Administered 2020-11-30: 50 ug via INTRAVENOUS
  Administered 2020-11-30: 25 ug via INTRAVENOUS
  Administered 2020-11-30: 50 ug via INTRAVENOUS

## 2020-11-30 MED ORDER — MIDAZOLAM HCL 2 MG/2ML IJ SOLN: 0.5000 mg | Freq: Once | INTRAMUSCULAR | Status: DC | PRN

## 2020-11-30 MED ORDER — LIDOCAINE HCL (CARDIAC) PF 100 MG/5ML IV SOSY
PREFILLED_SYRINGE | INTRAVENOUS | Status: DC | PRN
  Administered 2020-11-30: 60 mg via INTRAVENOUS

## 2020-11-30 MED ORDER — ONDANSETRON HCL 4 MG/2ML IJ SOLN
INTRAMUSCULAR | Status: AC
  Filled 2020-11-30: qty 2

## 2020-11-30 MED ORDER — LACTATED RINGERS IV SOLN: INTRAVENOUS | Status: DC

## 2020-11-30 MED ORDER — ORAL CARE MOUTH RINSE: 15.0000 mL | Freq: Once | OROMUCOSAL | Status: AC

## 2020-11-30 MED ORDER — PROPOFOL 10 MG/ML IV BOLUS
INTRAVENOUS | Status: DC | PRN
  Administered 2020-11-30: 200 mg via INTRAVENOUS

## 2020-11-30 MED ORDER — CHLORHEXIDINE GLUCONATE 0.12 % MT SOLN
15.0000 mL | Freq: Once | OROMUCOSAL | Status: AC
  Administered 2020-11-30: 15 mL via OROMUCOSAL

## 2020-11-30 MED ORDER — FENTANYL CITRATE (PF) 100 MCG/2ML IJ SOLN
INTRAMUSCULAR | Status: AC
  Filled 2020-11-30: qty 2

## 2020-11-30 MED ORDER — KETOROLAC TROMETHAMINE 30 MG/ML IJ SOLN
30.0000 mg | Freq: Once | INTRAMUSCULAR | Status: AC
  Administered 2020-11-30: 30 mg via INTRAVENOUS
  Filled 2020-11-30: qty 1

## 2020-11-30 MED ORDER — ONDANSETRON HCL 4 MG/2ML IJ SOLN: 4.0000 mg | Freq: Once | INTRAMUSCULAR | Status: DC | PRN

## 2020-11-30 MED ORDER — DEXMEDETOMIDINE (PRECEDEX) IN NS 20 MCG/5ML (4 MCG/ML) IV SYRINGE
PREFILLED_SYRINGE | INTRAVENOUS | Status: AC
  Filled 2020-11-30: qty 5

## 2020-11-30 MED ORDER — TRAMADOL HCL 50 MG PO TABS: 50.0000 mg | ORAL_TABLET | Freq: Four times a day (QID) | ORAL | 0 refills | Status: DC | PRN

## 2020-11-30 MED ORDER — CEFAZOLIN SODIUM-DEXTROSE 2-4 GM/100ML-% IV SOLN
2.0000 g | INTRAVENOUS | Status: AC
  Administered 2020-11-30: 2 g via INTRAVENOUS
  Filled 2020-11-30: qty 100

## 2020-11-30 MED ORDER — HYDROMORPHONE HCL 1 MG/ML IJ SOLN
INTRAMUSCULAR | Status: AC
  Filled 2020-11-30: qty 0.5

## 2020-11-30 MED ORDER — ONDANSETRON HCL 4 MG/2ML IJ SOLN
INTRAMUSCULAR | Status: DC | PRN
  Administered 2020-11-30: 4 mg via INTRAVENOUS

## 2020-11-30 MED ORDER — DEXAMETHASONE SODIUM PHOSPHATE 10 MG/ML IJ SOLN
INTRAMUSCULAR | Status: DC | PRN
  Administered 2020-11-30: 5 mg via INTRAVENOUS

## 2020-11-30 MED ORDER — SODIUM CHLORIDE 0.9 % IR SOLN
Status: DC | PRN
  Administered 2020-11-30: 3000 mL

## 2020-11-30 SURGICAL SUPPLY — 28 items
BAG HAMPER (MISCELLANEOUS) ×3 IMPLANT
CLOTH BEACON ORANGE TIMEOUT ST (SAFETY) ×3 IMPLANT
COVER LIGHT HANDLE STERIS (MISCELLANEOUS) ×6 IMPLANT
COVER WAND RF STERILE (DRAPES) ×3 IMPLANT
GAUZE 4X4 16PLY RFD (DISPOSABLE) ×6 IMPLANT
GLOVE BIOGEL PI IND STRL 7.0 (GLOVE) ×2 IMPLANT
GLOVE BIOGEL PI IND STRL 8 (GLOVE) ×1 IMPLANT
GLOVE BIOGEL PI INDICATOR 7.0 (GLOVE) ×4
GLOVE BIOGEL PI INDICATOR 8 (GLOVE) ×2
GLOVE ECLIPSE 8.0 STRL XLNG CF (GLOVE) ×3 IMPLANT
GOWN STRL REUS W/TWL LRG LVL3 (GOWN DISPOSABLE) ×3 IMPLANT
GOWN STRL REUS W/TWL XL LVL3 (GOWN DISPOSABLE) ×3 IMPLANT
HANDPIECE ABLA MINERVA ENDO (MISCELLANEOUS) ×3 IMPLANT
INST SET HYSTEROSCOPY (KITS) ×3 IMPLANT
IV NS 1000ML (IV SOLUTION) ×3
IV NS 1000ML BAXH (IV SOLUTION) ×1 IMPLANT
KIT TURNOVER CYSTO (KITS) ×3 IMPLANT
MANIFOLD NEPTUNE II (INSTRUMENTS) ×3 IMPLANT
MARKER SKIN DUAL TIP RULER LAB (MISCELLANEOUS) ×3 IMPLANT
NS IRRIG 1000ML POUR BTL (IV SOLUTION) ×3 IMPLANT
PACK BASIC III (CUSTOM PROCEDURE TRAY) ×3
PACK SRG BSC III STRL LF ECLPS (CUSTOM PROCEDURE TRAY) ×1 IMPLANT
PAD ARMBOARD 7.5X6 YLW CONV (MISCELLANEOUS) ×3 IMPLANT
PAD TELFA 3X4 1S STER (GAUZE/BANDAGES/DRESSINGS) ×3 IMPLANT
SET BASIN LINEN APH (SET/KITS/TRAYS/PACK) ×3 IMPLANT
SET IRRIG Y TYPE TUR BLADDER L (SET/KITS/TRAYS/PACK) ×3 IMPLANT
SHEET LAVH (DRAPES) ×3 IMPLANT
YANKAUER SUCT BULB TIP 10FT TU (MISCELLANEOUS) ×3 IMPLANT

## 2020-11-30 NOTE — Transfer of Care (Signed)
Immediate Anesthesia Transfer of Care Note  Patient: Teresa [name]  Procedure(s) Performed: DILATATION AND CURETTAGE/HYSTEROSCOPY WITH MINERVA Endometrial Ablation (N/A )  Patient Location: PACU  Anesthesia Type:General  Level of Consciousness: awake, alert  and oriented  Airway & Oxygen Therapy: Patient Spontanous Breathing and Patient connected to nasal cannula oxygen  Post-op Assessment: Report given to RN and Post -op Vital signs reviewed and stable  Post vital signs: Reviewed and stable  Last Vitals:  Vitals Value Taken Time  BP 108/76 11/30/20 1046  Temp    Pulse 70 11/30/20 1048  Resp 15 11/30/20 1048  SpO2 96 % 11/30/20 1048  Vitals shown include unvalidated device data.  Last Pain:  Vitals:   11/30/20 0854  PainSc: 0-No pain         Complications: No complications documented.

## 2020-11-30 NOTE — Op Note (Signed)
Preoperative diagnosis:  1.   menorrhagia                                         2.  Endometrial polyp   Postoperative diagnoses: Same as above   Procedure: Hysteroscopy, uterine curettage, endometrial ablation using Minerva  Surgeon: Florian Buff   Anesthesia: Laryngeal mask airway  Findings: The endometrium was normal with a anterior walll area of thickened endometrium possibly a polyp  Description of operation: The patient was taken to the operating room and placed in the supine position. She underwent general anesthesia using the laryngeal mask airway. She was placed in the dorsal lithotomy position and prepped and draped in the usual sterile fashion. A Graves speculum was placed and the anterior cervical lip was grasped with a single-tooth tenaculum. The cervix was dilated serially to allow passage of the hysteroscope. Diagnostic hysteroscopy was performed and was found to be normal. A vigorous uterine curettage was then performed and all tissue sent to pathology for evaluation.  I then proceeded to perform the Minerva endometrial ablation.   The uterus sounded to 9 cm The handpiece was attached to the Minerva power source/machine and the handpiece passed the checklist. The array was squeezed down to remove all of the air present.  The array was then place into the endometrial cavity and deployed to a length of 6 cm. The handpiece confirmed appropriate width by being in the green portion of the visual dial. The cervical cuff was then inflated to the point the CO2 indicator was in the green. The endometrial integrity check was then performed and integrity sequence was confirmed x 2. The heating was then begun and carried out for a total of 2 minutes(which is standard therapy time). When the plasma cycle was finished,  the cervical cuff was deflated and the array was removed with tissue present on the silicon membrane. There was appropriate post Minerva bleeding and uterine  discharge.     All of the equipment worked well throughout the procedure.  The patient was awakened from anesthesia and taken to the recovery room in good stable condition all counts were correct. She received 2 g of Ancef and 30 mg of Toradol preoperatively. She will be discharged from the recovery room and followed up in the office in 1- 2 weeks.   She can expect 4 weeks of post procedure bloody watery discharge  Florian Buff, MD  11/30/2020 10:36 AM

## 2020-11-30 NOTE — H&P (Signed)
Preoperative History and Physical  Teresa [name] is a 44 y.o. [phone] with Patient's last menstrual period was 11/03/2020 (exact date). admitted for a hysteroscopy uterine curettage Minerva endometrial ablation.  No dyspareunia no chronic pain Sonogram reveals possible polyp  PMH:        Past Medical History:  Diagnosis Date  . Anxiety   . Arthritis    knees   . Asthma due to seasonal allergies   . Bipolar disorder ([place])   . Cancer (HCC)    hx of cervical cancer- laser surgery done   . Carpal tunnel syndrome    bilateral   . GERD (gastroesophageal reflux disease)   . OCD (obsessive compulsive disorder)    2013  . PTSD (post-traumatic stress disorder)    2013  . Shortness of breath dyspnea    allergy related   . Trigger finger    bilateral middle fingers     PSH:          Past Surgical History:  Procedure Laterality Date  . CARDIAC CATHETERIZATION    . CHOLECYSTECTOMY N/A 05/29/2016   Procedure: LAPAROSCOPIC CHOLECYSTECTOMY WITH INTRAOPERATIVE CHOLANGIOGRAM;  Surgeon: Jackolyn Confer, MD;  Location: [facility];  Service: General;  Laterality: N/A;  . laser on cervix    . LUMBAR LAMINECTOMY Right 08/15/2017   Procedure: RIGHT SIDED LUMBAR FOUR-FIVE  MICRODISCECTOMY.;  Surgeon: Phylliss Bob, MD;  Location: [facility];  Service: Orthopedics;  Laterality: Right;  . TUBAL LIGATION    . WISDOM TOOTH EXTRACTION      POb/GynH:              OB History    Gravida  6   Para  4   Term  4   Preterm      AB  2   Living  4     SAB  1   TAB  1   Ectopic      Multiple      Live Births              SH:   Social History        Tobacco Use  . Smoking status: Current Every Day Smoker    Packs/day: 1.00    Years: 24.00    Pack years: 24.00    Types: Cigarettes  . Smokeless tobacco: Never Used  Vaping Use  . Vaping Use: Never used  Substance Use Topics  . Alcohol use: Yes  . Drug use: Yes    Types:  Marijuana    FH:         Family History  Problem Relation Age of Onset  . Hypertension Maternal Grandfather   . COPD Mother      Allergies:       Allergies  Allergen Reactions  . Latex Other (See Comments)    Skin blisters, becomes red and swollen  . Vicodin [Hydrocodone-Acetaminophen] Itching    Medications:       Current Outpatient Medications:  .  megestrol (MEGACE) 40 MG tablet, Take 2 daily, Disp: 60 tablet, Rfl: 0 .  promethazine (PHENERGAN) 25 MG tablet, Take 1 tablet (25 mg total) by mouth every 6 (six) hours as needed for nausea or vomiting., Disp: 30 tablet, Rfl: 1 .  tiZANidine (ZANAFLEX) 4 MG tablet, Take 4 mg by mouth at bedtime., Disp: , Rfl:  .  zolpidem (AMBIEN) 10 MG tablet, Take 10 mg by mouth at bedtime as needed for sleep., Disp: , Rfl:   Review of Systems:  Review of Systems  Constitutional: Negative for fever, chills, weight loss, malaise/fatigue and diaphoresis.  HENT: Negative for hearing loss, ear pain, nosebleeds, congestion, sore throat, neck pain, tinnitus and ear discharge.   Eyes: Negative for blurred vision, double vision, photophobia, pain, discharge and redness.  Respiratory: Negative for cough, hemoptysis, sputum production, shortness of breath, wheezing and stridor.   Cardiovascular: Negative for chest pain, palpitations, orthopnea, claudication, leg swelling and PND.  Gastrointestinal: Positive for abdominal pain. Negative for heartburn, nausea, vomiting, diarrhea, constipation, blood in stool and melena.  Genitourinary: Negative for dysuria, urgency, frequency, hematuria and flank pain.  Musculoskeletal: Negative for myalgias, back pain, joint pain and falls.  Skin: Negative for itching and rash.  Neurological: Negative for dizziness, tingling, tremors, sensory change, speech change, focal weakness, seizures, loss of consciousness, weakness and headaches.  Endo/Heme/Allergies: Negative for environmental allergies and  polydipsia. Does not bruise/bleed easily.  Psychiatric/Behavioral: Negative for depression, suicidal ideas, hallucinations, memory loss and substance abuse. The patient is not nervous/anxious and does not have insomnia.      PHYSICAL EXAM:  Blood pressure 126/81, pulse 80, weight 152 lb (68.9 kg), last menstrual period 11/03/2020.    Vitals reviewed. Constitutional: She is oriented to person, place, and time. She appears well-developed and well-nourished.  HENT:  Head: Normocephalic and atraumatic.  Right Ear: External ear normal.  Left Ear: External ear normal.  Nose: Nose normal.  Mouth/Throat: Oropharynx is clear and moist.  Eyes: Conjunctivae and EOM are normal. Pupils are equal, round, and reactive to light. Right eye exhibits no discharge. Left eye exhibits no discharge. No scleral icterus.  Neck: Normal range of motion. Neck supple. No tracheal deviation present. No thyromegaly present.  Cardiovascular: Normal rate, regular rhythm, normal heart sounds and intact distal pulses.  Exam reveals no gallop and no friction rub.   No murmur heard. Respiratory: Effort normal and breath sounds normal. No respiratory distress. She has no wheezes. She has no rales. She exhibits no tenderness.  GI: Soft. Bowel sounds are normal. She exhibits no distension and no mass. There is tenderness. There is no rebound and no guarding.  Genitourinary:       Vulva is normal without lesions Vagina is pink moist without discharge Cervix normal in appearance and pap is normal Uterus is normal size, contour, position, consistency, mobility, non-tender Adnexa is negative with normal sized ovaries by sonogram  Musculoskeletal: Normal range of motion. She exhibits no edema and no tenderness.  Neurological: She is alert and oriented to person, place, and time. She has normal reflexes. She displays normal reflexes. No cranial nerve deficit. She exhibits normal muscle tone. Coordination normal.  Skin: Skin  is warm and dry. No rash noted. No erythema. No pallor.  Psychiatric: She has a normal mood and affect. Her behavior is normal. Judgment and thought content normal.    Labs: Results for orders placed or performed during the hospital encounter of 11/28/20 (from the past 72 hour(s))  SARS CORONAVIRUS 2 (TAT 6-24 HRS) Nasopharyngeal Nasopharyngeal Swab     Status: None   Collection Time: 11/28/20 12:01 PM   Specimen: Nasopharyngeal Swab  Result Value Ref Range   SARS Coronavirus 2 NEGATIVE NEGATIVE    Comment: (NOTE) SARS-CoV-2 target nucleic acids are NOT DETECTED.  The SARS-CoV-2 RNA is generally detectable in upper and lower respiratory specimens during the acute phase of infection. Negative results do not preclude SARS-CoV-2 infection, do not rule out co-infections with other pathogens, and should not be used as the  sole basis for treatment or other patient management decisions. Negative results must be combined with clinical observations, patient history, and epidemiological information. The expected result is Negative.  Fact Sheet for Patients: SugarRoll.be  Fact Sheet for Healthcare Providers: https://www.woods-mathews.com/  This test is not yet approved or cleared by the [place] FDA and  has been authorized for detection and/or diagnosis of SARS-CoV-2 by FDA under an Emergency Use Authorization (EUA). This EUA will remain  in effect (meaning this test can be used) for the duration of the COVID-19 declaration under Se ction 564(b)(1) of the Act, 21 U.S.C. section 360bbb-3(b)(1), unless the authorization is terminated or revoked sooner.  Performed at [facility], [place] [address], [place] [address]   CBC     Status: None   Collection Time: 11/28/20 12:25 PM  Result Value Ref Range   WBC 10.2 4.0 - 10.5 K/uL   RBC 4.50 3.87 - 5.11 MIL/uL   Hemoglobin 13.6 12.0 - 15.0 g/dL   HCT 42.0 36 - 46 %   MCV 93.3  80.0 - 100.0 fL   MCH 30.2 26.0 - 34.0 pg   MCHC 32.4 30.0 - 36.0 g/dL   RDW 12.8 11.5 - 15.5 %   Platelets 353 150 - 400 K/uL   nRBC 0.0 0.0 - 0.2 %    Comment: Performed at [facility], [address], [place], [place] [address]  Comprehensive metabolic panel     Status: Abnormal   Collection Time: 11/28/20 12:25 PM  Result Value Ref Range   Sodium 137 135 - 145 mmol/L   Potassium 4.0 3.5 - 5.1 mmol/L   Chloride 104 98 - 111 mmol/L   CO2 25 22 - 32 mmol/L   Glucose, Bld 82 70 - 99 mg/dL    Comment: Glucose reference range applies only to samples taken after fasting for at least 8 hours.   BUN 9 6 - 20 mg/dL   Creatinine, Ser 0.58 0.44 - 1.00 mg/dL   Calcium 9.0 8.9 - 10.3 mg/dL   Total Protein 7.1 6.5 - 8.1 g/dL   Albumin 4.1 3.5 - 5.0 g/dL   AST 13 (L) 15 - 41 U/L   ALT 17 0 - 44 U/L   Alkaline Phosphatase 36 (L) 38 - 126 U/L   Total Bilirubin 0.5 0.3 - 1.2 mg/dL   GFR, Estimated >60 >60 mL/min    Comment: (NOTE) Calculated using the CKD-EPI Creatinine Equation (2021)    Anion gap 8 5 - 15    Comment: Performed at [facility], [address] [place], [place] [address]  hCG, quantitative, pregnancy     Status: None   Collection Time: 11/28/20 12:26 PM  Result Value Ref Range   hCG, Beta Chain, Quant, S <1 <5 mIU/mL    Comment:          GEST. AGE      CONC.  (mIU/mL)   <=1 WEEK        5 - 50     2 WEEKS       50 - 500     3 WEEKS       100 - 10,000     4 WEEKS     1,000 - 30,000     5 WEEKS     3,500 - 115,000   6-8 WEEKS     12,000 - 270,000    12 WEEKS     15,000 - 220,000  FEMALE AND NON-PREGNANT FEMALE:     LESS THAN 5 mIU/mL Performed at [facility], [address], [place], [place] [address]   Rapid HIV screen (HIV 1/2 Ab+Ag)     Status: None   Collection Time: 11/28/20 12:26 PM  Result Value Ref Range   HIV-1 P24 Antigen - HIV24 NON REACTIVE NON REACTIVE    Comment: (NOTE) Detection of p24 may be inhibited by biotin in the sample, causing false  negative results in acute infection.    HIV 1/2 Antibodies NON REACTIVE NON REACTIVE   Interpretation (HIV Ag Ab)      A non reactive test result means that HIV 1 or HIV 2 antibodies and HIV 1 p24 antigen were not detected in the specimen.    Comment: Performed at [facility], [address], [place], [place] [address]  Urinalysis, Routine w reflex microscopic Urine, Clean Catch     Status: Abnormal   Collection Time: 11/28/20 12:32 PM  Result Value Ref Range   Color, Urine YELLOW YELLOW   APPearance HAZY (A) CLEAR   Specific Gravity, Urine 1.012 1.005 - 1.030   pH 7.0 5.0 - 8.0   Glucose, UA NEGATIVE NEGATIVE mg/dL   Hgb urine dipstick NEGATIVE NEGATIVE   Bilirubin Urine NEGATIVE NEGATIVE   Ketones, ur NEGATIVE NEGATIVE mg/dL   Protein, ur NEGATIVE NEGATIVE mg/dL   Nitrite NEGATIVE NEGATIVE   Leukocytes,Ua NEGATIVE NEGATIVE    Comment: Performed at [address] [place], [place] [address]    EKG:    Orders placed or performed during the hospital encounter of 08/15/17  . EKG 12-Lead  . EKG 12-Lead    Imaging Studies:  Imaging Results  US PELVIC COMPLETE WITH TRANSVAGINAL  Result Date: 10/18/2020 CLINICAL DATA:  Menorrhagia with irregular cycle; unknown LMP EXAM: TRANSABDOMINAL AND TRANSVAGINAL ULTRASOUND OF PELVIS TECHNIQUE: Both transabdominal and transvaginal ultrasound examinations of the pelvis were performed. Transabdominal technique was performed for global imaging of the pelvis including uterus, ovaries, adnexal regions, and pelvic cul-de-sac. It was necessary to proceed with endovaginal exam following the transabdominal exam to visualize the lower uterine segment and ovaries. COMPARISON:  09/23/2009 FINDINGS: Uterus Measurements: 9.0 x 4.2 x 5.2 cm = volume: 102 mL. Slightly heterogeneous myometrium. No focal uterine mass. Endometrium Thickness: 9 mm. Subtle hypoechoic focus identified within the endometrial complex at the mid uterus, may represent  heterogeneous myometrium or a subtle polyp 8 x 7 x 10 mm. No endometrial fluid. Right ovary Measurements: 2.2 x 1.5 x 1.8 cm = volume: 3.1 mL. Normal morphology without mass Left ovary Measurements: 2.5 x 2.6 x 1.5 cm = volume: 5.1 mL. Normal morphology without mass Other findings No free pelvic fluid.  No adnexal masses. IMPRESSION: Questionable subtle polyp at mid uterus 10 x 7 x 10 mm versus inhomogeneous myometrium; consider sonohysterogram for further evaluation, prior to hysteroscopy or endometrial biopsy. Remainder of exam unremarkable. Electronically Signed   By: Lavonia Dana M.D.   On: 10/18/2020 16:25       Assessment: Menorrhagia Endometrial polyp  Plan: Hysteroscopy uterine curettage Minerva endometrial ablation Florian Buff 11/08/2020 3:18 PM

## 2020-11-30 NOTE — Anesthesia Preprocedure Evaluation (Signed)
Anesthesia Evaluation  Patient identified by MRN, date of birth, ID band Patient awake    Reviewed: Allergy & Precautions, H&P , NPO status , Patient's Chart, lab work & pertinent test results, reviewed documented beta blocker date and time   Airway Mallampati: II  TM Distance: >3 FB Neck ROM: full    Dental no notable dental hx. (+) Teeth Intact   Pulmonary shortness of breath, asthma , Current Smoker and Patient abstained from smoking.,    Pulmonary exam normal breath sounds clear to auscultation       Cardiovascular Exercise Tolerance: Good negative cardio ROS   Rhythm:regular Rate:Normal     Neuro/Psych PSYCHIATRIC DISORDERS Anxiety Depression Bipolar Disorder  Neuromuscular disease    GI/Hepatic Neg liver ROS, GERD  Medicated,  Endo/Other  negative endocrine ROS  Renal/GU negative Renal ROS  negative genitourinary   Musculoskeletal   Abdominal   Peds  Hematology negative hematology ROS (+)   Anesthesia Other Findings   Reproductive/Obstetrics negative OB ROS                             Anesthesia Physical Anesthesia Plan  ASA: II  Anesthesia Plan: General   Post-op Pain Management:    Induction:   PONV Risk Score and Plan: Ondansetron  Airway Management Planned:   Additional Equipment:   Intra-op Plan:   Post-operative Plan:   Informed Consent: I have reviewed the patients History and Physical, chart, labs and discussed the procedure including the risks, benefits and alternatives for the proposed anesthesia with the patient or authorized representative who has indicated his/her understanding and acceptance.     Dental Advisory Given  Plan Discussed with: CRNA  Anesthesia Plan Comments:         Anesthesia Quick Evaluation

## 2020-11-30 NOTE — Anesthesia Postprocedure Evaluation (Signed)
Anesthesia Post Note  Patient: Teresa [name]  Procedure(s) Performed: DILATATION AND CURETTAGE/HYSTEROSCOPY WITH MINERVA Endometrial Ablation (N/A )  Patient location during evaluation: PACU Anesthesia Type: General Level of consciousness: oriented, awake and awake and alert Pain management: pain level controlled Vital Signs Assessment: post-procedure vital signs reviewed and stable Respiratory status: spontaneous breathing, respiratory function stable and nonlabored ventilation Cardiovascular status: blood pressure returned to baseline Postop Assessment: no apparent nausea or vomiting Anesthetic complications: no   No complications documented.   Last Vitals:  Vitals:   11/30/20 1045 11/30/20 1100  BP: 108/76 127/80  Pulse: 75 69  Resp:  (!) 9  Temp: 36.6 C   SpO2: 96% 97%    Last Pain:  Vitals:   11/30/20 1100  PainSc: [place] Reymundo Winship

## 2020-11-30 NOTE — Discharge Instructions (Signed)
Endometrial Ablation  Endometrial ablation is a procedure that destroys the thin inner layer of the lining of the uterus (endometrium). This procedure may be done:  To stop heavy periods.  To stop bleeding that is causing anemia.  To control irregular bleeding.  To treat bleeding caused by small tumors (fibroids) in the endometrium.  This procedure is often an alternative to major surgery, such as removal of the uterus and cervix (hysterectomy). As a result of this procedure:  You may not be able to have children. However, if you are premenopausal (you have not gone through menopause):  You may still have a small chance of getting pregnant.  You will need to use a reliable method of birth control after the procedure to prevent pregnancy.  You may stop having a menstrual period, or you may have only a small amount of bleeding during your period. Menstruation may return several years after the procedure.  Tell a health care provider about:  Any allergies you have.  All medicines you are taking, including vitamins, herbs, eye drops, creams, and over-the-counter medicines.  Any problems you or family members have had with the use of anesthetic medicines.  Any blood disorders you have.  Any surgeries you have had.  Any medical conditions you have.  What are the risks?  Generally, this is a safe procedure. However, problems may occur, including:  A hole (perforation) in the uterus or bowel.  Infection of the uterus, bladder, or vagina.  Bleeding.  Damage to other structures or organs.  An air bubble in the lung (air embolus).  Problems with pregnancy after the procedure.  Failure of the procedure.  Decreased ability to diagnose cancer in the endometrium.  What happens before the procedure?  You will have tests of your endometrium to make sure there are no pre-cancerous cells or cancer cells present.  You may have an ultrasound of the uterus.  You may be given medicines to thin the endometrium.  Ask your health care  provider about:  Changing or stopping your regular medicines. This is especially important if you take diabetes medicines or blood thinners.  Taking medicines such as aspirin and ibuprofen. These medicines can thin your blood. Do not take these medicines before your procedure if your doctor tells you not to.  Plan to have someone take you home from the hospital or clinic.  What happens during the procedure?    You will lie on an exam table with your feet and legs supported as in a pelvic exam.  To lower your risk of infection:  Your health care team will wash or sanitize their hands and put on germ-free (sterile) gloves.  Your genital area will be washed with soap.  An IV tube will be inserted into one of your veins.  You will be given a medicine to help you relax (sedative).  A surgical instrument with a light and camera (resectoscope) will be inserted into your vagina and moved into your uterus. This allows your surgeon to see inside your uterus.  Endometrial tissue will be removed using one of the following methods:  Radiofrequency. This method uses a radiofrequency-alternating electric current to remove the endometrium.  Cryotherapy. This method uses extreme cold to freeze the endometrium.  Heated-free liquid. This method uses a heated saltwater (saline) solution to remove the endometrium.  Microwave. This method uses high-energy microwaves to heat up the endometrium and remove it.  Thermal balloon. This method involves inserting a catheter with a balloon tip into   filled with heated fluid to remove the endometrium. The procedure may vary among health care providers and hospitals. What happens after the procedure?  Your blood pressure, heart rate, breathing rate, and blood oxygen level will be monitored until the medicines you were given have worn off.  As tissue healing occurs, you may  notice vaginal bleeding for 4-6 weeks after the procedure. You may also experience: ? Cramps. ? Thin, watery vaginal discharge that is light pink or brown in color. ? A need to urinate more frequently than usual. ? Nausea.  Do not drive for 24 hours if you were given a sedative.  Do not have sex or insert anything into your vagina until your health care provider approves. Summary  Endometrial ablation is done to treat the many causes of heavy menstrual bleeding.  The procedure may be done only after medications have been tried to control the bleeding.  Plan to have someone take you home from the hospital or clinic. This information is not intended to replace advice given to you by your health care provider. Make sure you discuss any questions you have with your health care provider. Document Revised: 06/03/2018 Document Reviewed: 01/03/2017 Elsevier Patient Education  2020 [place] Anesthesia, Adult, Care After This sheet gives you information about how to care for yourself after your procedure. Your health care provider may also give you more specific instructions. If you have problems or questions, contact your health care provider. What can I expect after the procedure? After the procedure, the following side effects are common:  Pain or discomfort at the IV site.  Nausea.  Vomiting.  Sore throat.  Trouble concentrating.  Feeling cold or chills.  Weak or tired.  Sleepiness and fatigue.  Soreness and body aches. These side effects can affect parts of the body that were not involved in surgery. Follow these instructions at home:  For at least 24 hours after the procedure:  Have a responsible adult stay with you. It is important to have someone help care for you until you are awake and alert.  Rest as needed.  Do not: ? Participate in activities in which you could fall or become injured. ? Drive. ? Use heavy machinery. ? Drink  alcohol. ? Take sleeping pills or medicines that cause drowsiness. ? Make important decisions or sign legal documents. ? Take care of children on your own. Eating and drinking  Follow any instructions from your health care provider about eating or drinking restrictions.  When you feel hungry, start by eating small amounts of foods that are soft and easy to digest (bland), such as toast. Gradually return to your regular diet.  Drink enough fluid to keep your urine pale yellow.  If you vomit, rehydrate by drinking water, juice, or clear broth. General instructions  If you have sleep apnea, surgery and certain medicines can increase your risk for breathing problems. Follow instructions from your health care provider about wearing your sleep device: ? Anytime you are sleeping, including during daytime naps. ? While taking prescription pain medicines, sleeping medicines, or medicines that make you drowsy.  Return to your normal activities as told by your health care provider. Ask your health care provider what activities are safe for you.  Take over-the-counter and prescription medicines only as told by your health care provider.  If you smoke, do not smoke without supervision.  Keep all follow-up visits as told by your health care provider. This is important. Contact a  health care provider if:  You have nausea or vomiting that does not get better with medicine.  You cannot eat or drink without vomiting.  You have pain that does not get better with medicine.  You are unable to pass urine.  You develop a skin rash.  You have a fever.  You have redness around your IV site that gets worse. Get help right away if:  You have difficulty breathing.  You have chest pain.  You have blood in your urine or stool, or you vomit blood. Summary  After the procedure, it is common to have a sore throat or nausea. It is also common to feel tired.  Have a responsible adult stay with you  for the first 24 hours after general anesthesia. It is important to have someone help care for you until you are awake and alert.  When you feel hungry, start by eating small amounts of foods that are soft and easy to digest (bland), such as toast. Gradually return to your regular diet.  Drink enough fluid to keep your urine pale yellow.  Return to your normal activities as told by your health care provider. Ask your health care provider what activities are safe for you. This information is not intended to replace advice given to you by your health care provider. Make sure you discuss any questions you have with your health care provider. Document Revised: 12/20/2017 Document Reviewed: 08/02/2017 Elsevier Patient Education  [place].

## 2020-11-30 NOTE — Anesthesia Procedure Notes (Signed)
Procedure Name: LMA Insertion Date/Time: 11/30/2020 10:07 AM Performed by: Karna Dupes, CRNA Pre-anesthesia Checklist: Patient identified, Emergency Drugs available, Suction available and Patient being monitored Patient Re-evaluated:Patient Re-evaluated prior to induction Oxygen Delivery Method: Circle system utilized Preoxygenation: Pre-oxygenation with 100% oxygen Induction Type: IV induction Ventilation: Mask ventilation without difficulty LMA: LMA inserted LMA Size: 3.0 Tube secured with: Tape Dental Injury: Teeth and Oropharynx as per pre-operative assessment

## 2020-12-01 ENCOUNTER — Encounter (HOSPITAL_COMMUNITY): Payer: Self-pay | Admitting: Obstetrics & Gynecology

## 2020-12-01 DIAGNOSIS — Z029 Encounter for administrative examinations, unspecified: Secondary | ICD-10-CM

## 2020-12-01 LAB — SURGICAL PATHOLOGY

## 2020-12-02 ENCOUNTER — Telehealth: Payer: Self-pay | Admitting: Obstetrics & Gynecology

## 2020-12-02 NOTE — Telephone Encounter (Signed)
Pt calling to see if we have faxed the signed form for her to be able to return to work on Monday She requested a call back ASAP  Please advise & call pt  # she left is [phone]

## 2020-12-02 NOTE — Telephone Encounter (Signed)
LMOM to notify that form is complete & has been faxed & there is a copy here for her to pick up if she'd like to

## 2020-12-08 ENCOUNTER — Telehealth (INDEPENDENT_AMBULATORY_CARE_PROVIDER_SITE_OTHER): Payer: BC Managed Care – PPO | Admitting: Obstetrics & Gynecology

## 2020-12-08 ENCOUNTER — Encounter: Payer: Self-pay | Admitting: Obstetrics & Gynecology

## 2020-12-08 DIAGNOSIS — Z9889 Other specified postprocedural states: Secondary | ICD-10-CM

## 2020-12-08 NOTE — Progress Notes (Signed)
Telephone call visit  Pt is doing well some LLQ pain but working and doing normal activities HPI: Patient returns for routine postoperative follow-up having undergone hysteroscopy uterine curettage Minerva ablation on 11/30/20.  The patient's immediate postoperative recovery has been unremarkable. Since hospital discharge the patient reports watery discharge.   Current Outpatient Medications: ketorolac (TORADOL) 10 MG tablet, Take 1 tablet (10 mg total) by mouth every 8 (eight) hours as needed., Disp: 15 tablet, Rfl: 0 ondansetron (ZOFRAN ODT) 8 MG disintegrating tablet, Take 1 tablet (8 mg total) by mouth every 8 (eight) hours as needed for nausea or vomiting., Disp: 8 tablet, Rfl: 0 promethazine (PHENERGAN) 25 MG tablet, Take 1 tablet (25 mg total) by mouth every 6 (six) hours as needed for nausea or vomiting., Disp: 30 tablet, Rfl: 1 traMADol (ULTRAM) 50 MG tablet, Take 1 tablet (50 mg total) by mouth every 6 (six) hours as needed., Disp: 12 tablet, Rfl: 0 zolpidem (AMBIEN) 10 MG tablet, Take 10 mg by mouth at bedtime as needed for sleep., Disp: , Rfl:   No current facility-administered medications for this visit.    Last menstrual period 11/23/2020.  Physical Exam: n/a  Diagnostic Tests:   Pathology: Submucosal myoma  Impression: S/p hysteroscopy D&C  Plan:   Follow up: prn    Florian Buff, MD

## 2020-12-14 ENCOUNTER — Telehealth: Payer: Self-pay | Admitting: Obstetrics & Gynecology

## 2020-12-14 NOTE — Telephone Encounter (Signed)
Pt left message with front desk wanting to know how long she needs to wait to be sexually active. Advised I would check with Dr. Elonda Husky and let her know.

## 2020-12-15 NOTE — Telephone Encounter (Signed)
4 weeks total post op

## 2020-12-15 NOTE — Telephone Encounter (Signed)
Attempted to call patient back.  No answer.

## 2020-12-16 ENCOUNTER — Telehealth: Payer: Self-pay | Admitting: Obstetrics & Gynecology

## 2020-12-16 NOTE — Telephone Encounter (Signed)
Patient called and wanted to know she can start back having sexual intercourse. And stated that she hasn't received a response back.

## 2020-12-16 NOTE — Telephone Encounter (Signed)
LVM for patient ,informing Patient that a nurse did try to contact her on 12/15/20 and there was no answer. I also informed the patient that Dr. Elonda Husky documented 4 weeks total post op and if she had any other questions or concerns, to call our office.

## 2021-01-09 NOTE — Telephone Encounter (Signed)
Opened in error

## 2021-01-31 ENCOUNTER — Other Ambulatory Visit: Payer: Self-pay

## 2021-01-31 ENCOUNTER — Other Ambulatory Visit (HOSPITAL_COMMUNITY): Payer: Self-pay | Admitting: Physician Assistant

## 2021-01-31 ENCOUNTER — Ambulatory Visit (HOSPITAL_COMMUNITY)
Admission: RE | Admit: 2021-01-31 | Discharge: 2021-01-31 | Disposition: A | Discharge status: Home / Self Care | Payer: BC Managed Care – PPO | Source: Ambulatory Visit | Attending: Physician Assistant | Admitting: Physician Assistant

## 2021-01-31 DIAGNOSIS — M25561 Pain in right knee: Secondary | ICD-10-CM

## 2021-01-31 DIAGNOSIS — Z6827 Body mass index (BMI) 27.0-27.9, adult: Secondary | ICD-10-CM | POA: Diagnosis not present

## 2021-01-31 DIAGNOSIS — N760 Acute vaginitis: Secondary | ICD-10-CM | POA: Diagnosis not present

## 2021-01-31 DIAGNOSIS — E663 Overweight: Secondary | ICD-10-CM | POA: Diagnosis not present

## 2021-01-31 IMAGING — DX DG KNEE AP/LAT W/ SUNRISE*R*
3 series · 3 of 3 positions shown · non-contrast
Comparison: None.

CLINICAL DATA: Right knee pain for several years

EXAM:
RIGHT KNEE 3 VIEWS

[knee ap]
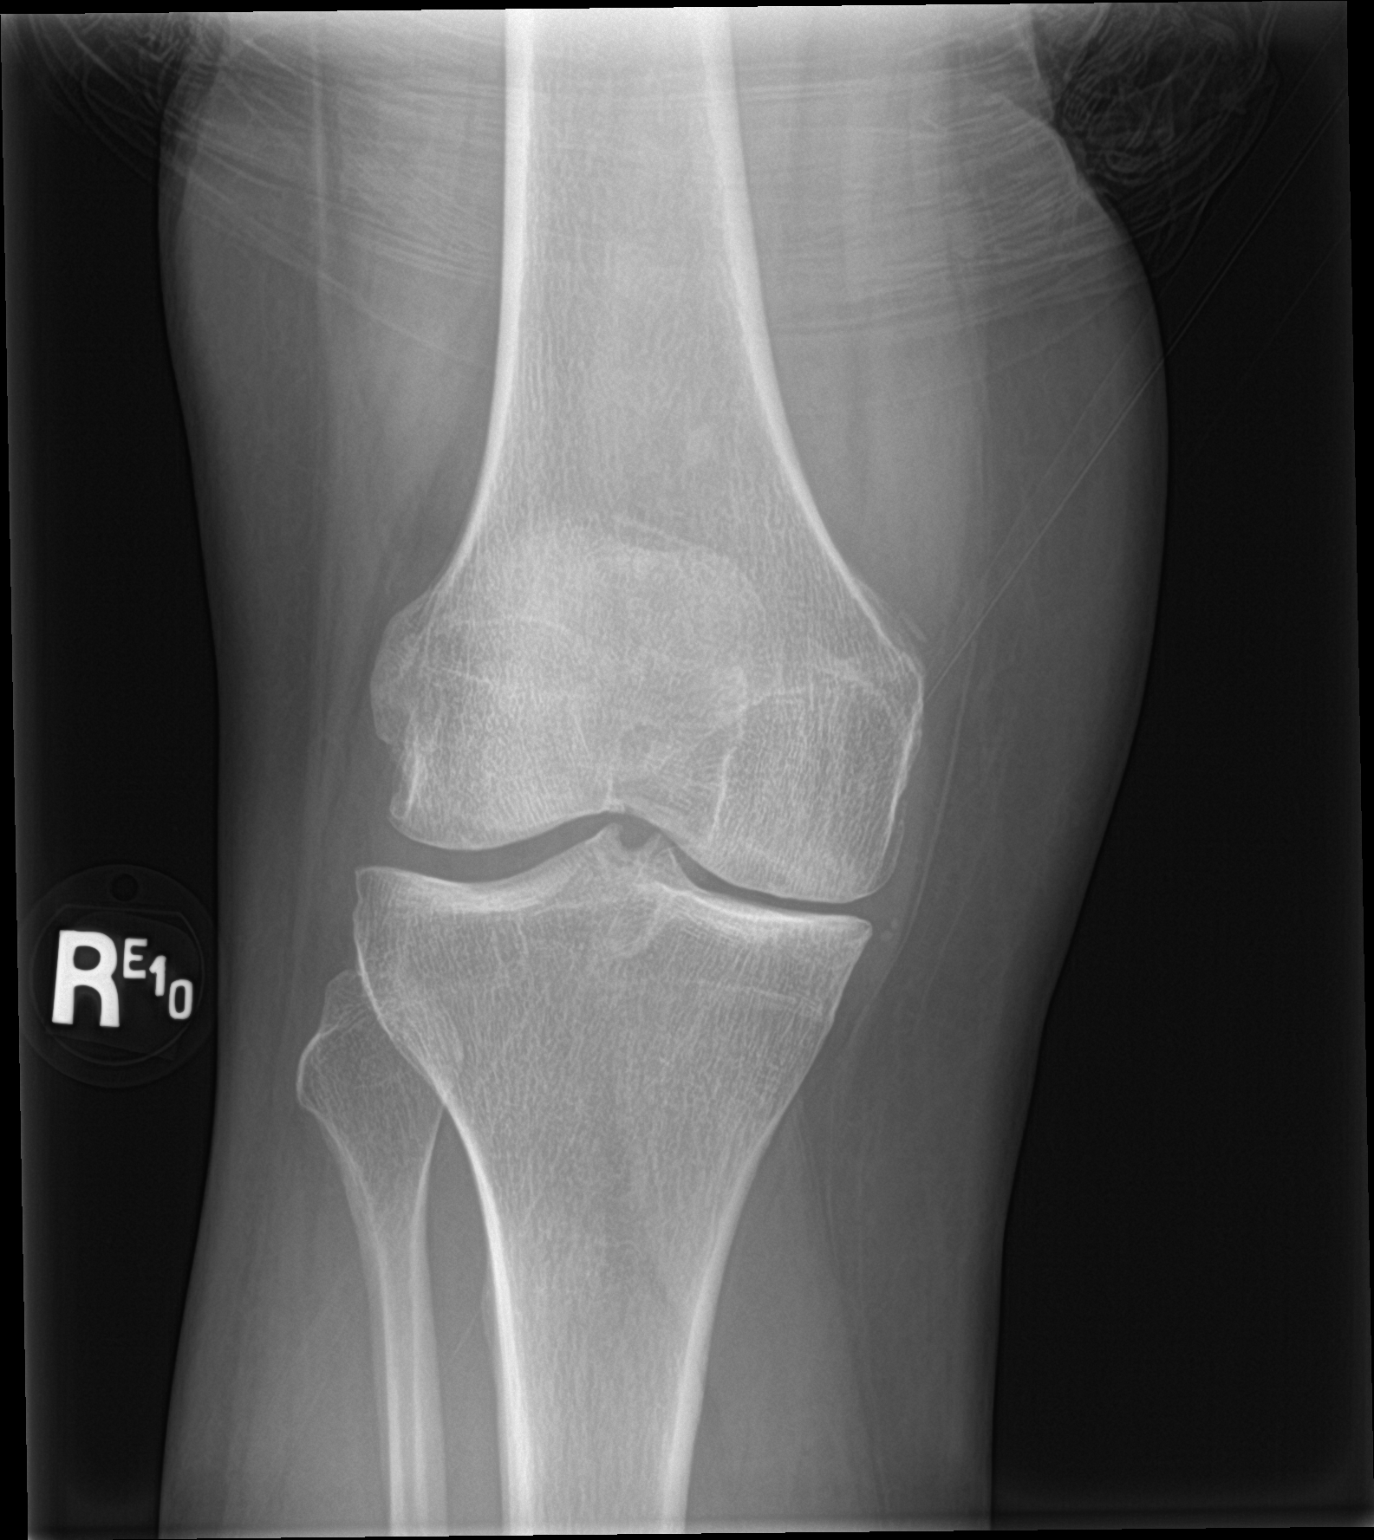

[knee lat]
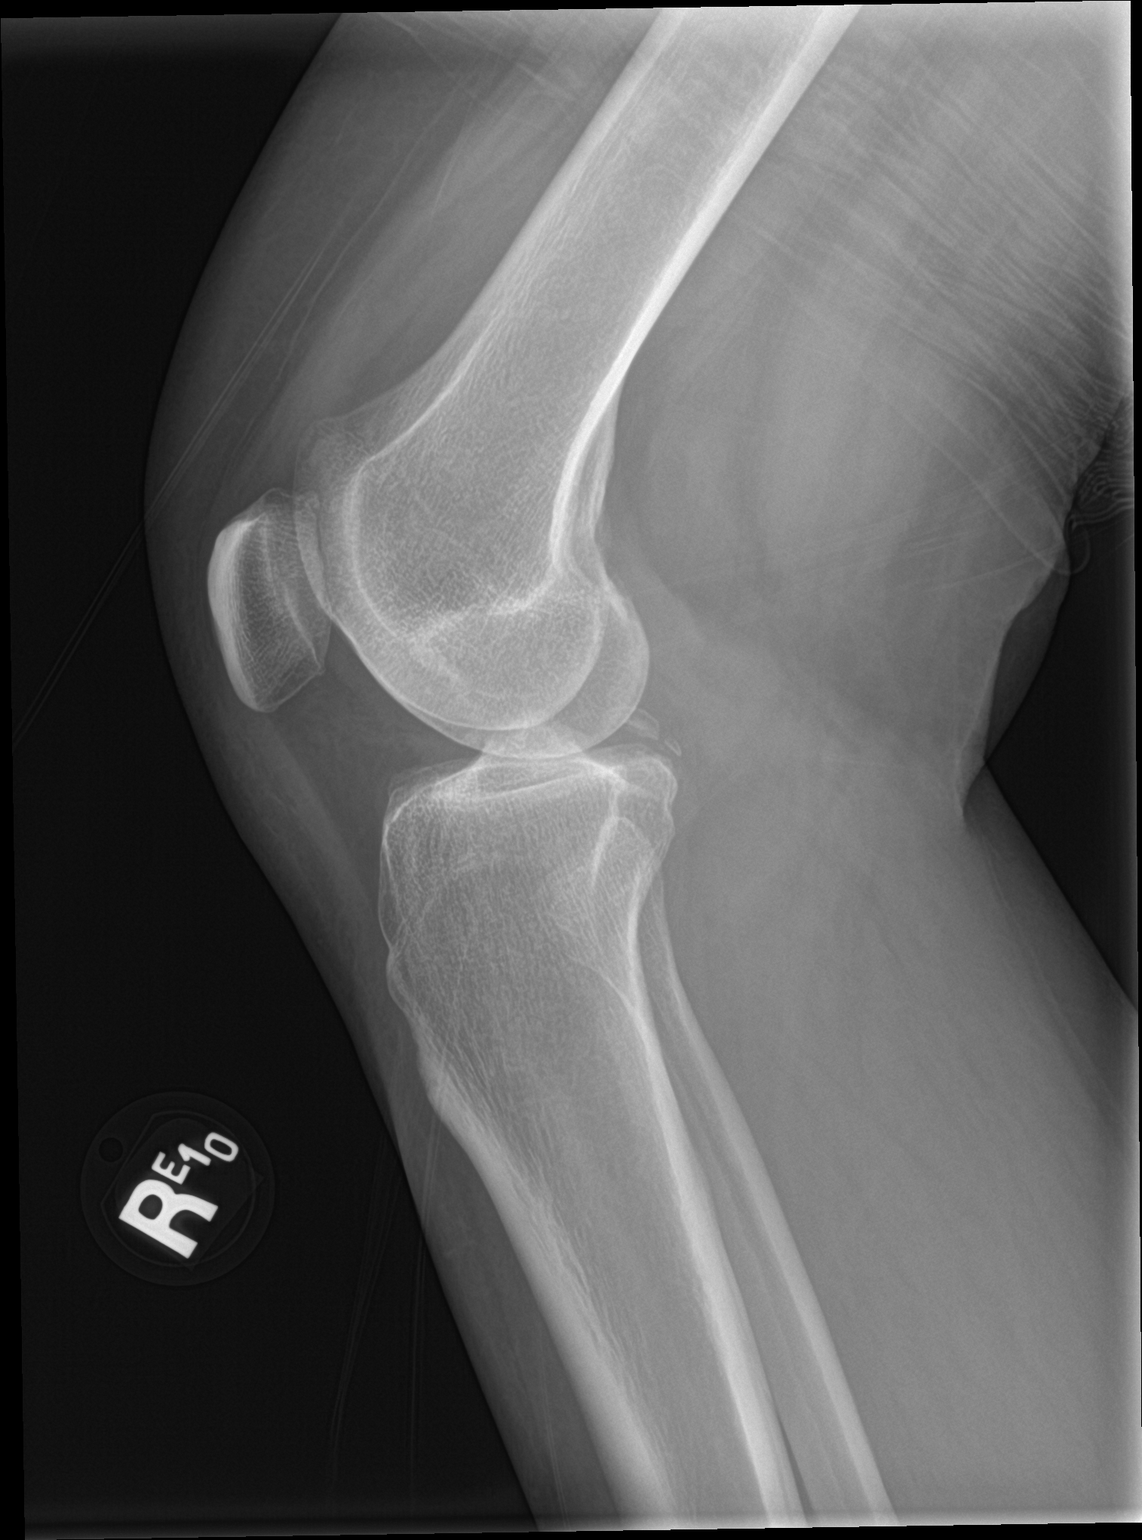

[knee sunrise]
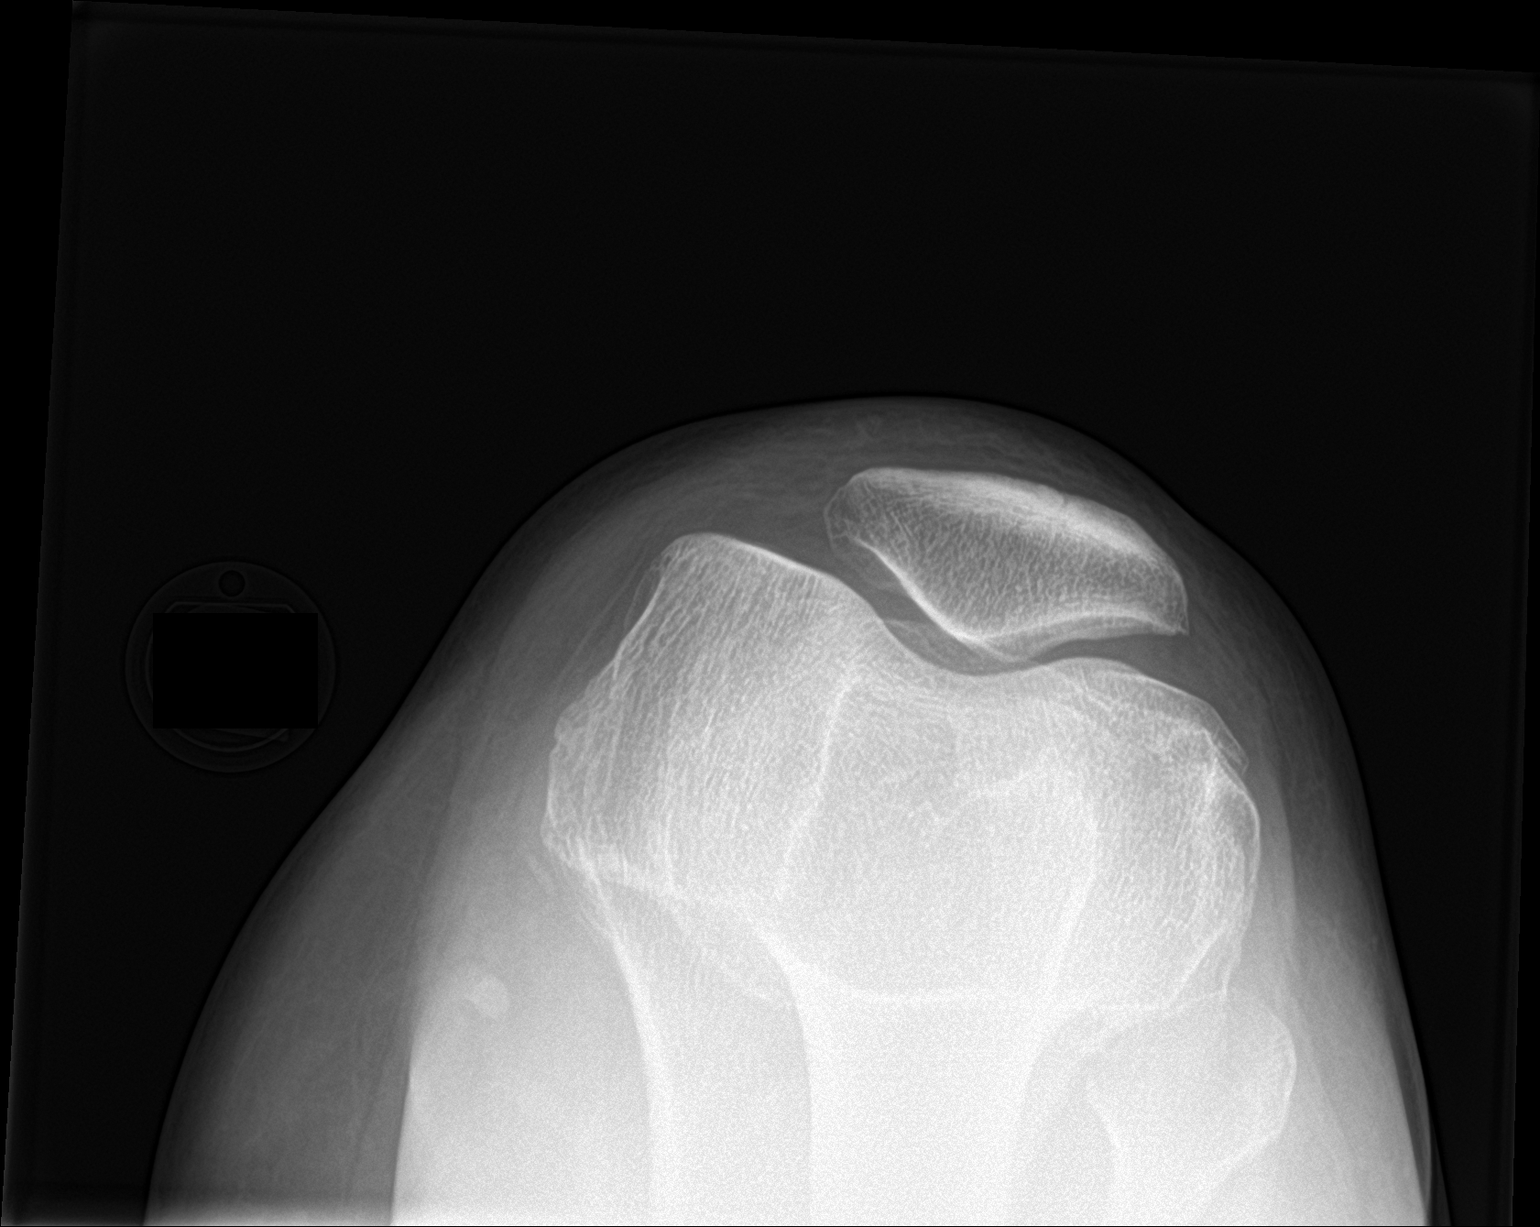

[3 of 3 positions shown; findings below may reference images not displayed]

FINDINGS: Medial joint space narrowing is noted. No acute fracture or
dislocation is noted. No joint effusion is seen.
IMPRESSION: Mild degenerative change without acute abnormality.

## 2021-02-14 ENCOUNTER — Other Ambulatory Visit (HOSPITAL_COMMUNITY)
Admission: RE | Admit: 2021-02-14 | Discharge: 2021-02-14 | Disposition: A | Discharge status: Home / Self Care | Payer: BC Managed Care – PPO | Source: Ambulatory Visit | Attending: Obstetrics & Gynecology | Admitting: Obstetrics & Gynecology

## 2021-02-14 ENCOUNTER — Other Ambulatory Visit (INDEPENDENT_AMBULATORY_CARE_PROVIDER_SITE_OTHER): Payer: BC Managed Care – PPO | Admitting: *Deleted

## 2021-02-14 ENCOUNTER — Other Ambulatory Visit: Payer: Self-pay

## 2021-02-14 DIAGNOSIS — Z202 Contact with and (suspected) exposure to infections with a predominantly sexual mode of transmission: Secondary | ICD-10-CM

## 2021-02-14 NOTE — Progress Notes (Signed)
Chart reviewed for nurse visit. Agree with plan of care.  Estill Dooms, NP 02/14/2021 4:14 PM

## 2021-02-14 NOTE — Progress Notes (Signed)
   NURSE VISIT- VAGINITIS/STD/POC  SUBJECTIVE:  Teresa [name] is a 45 y.o. [id number] GYN patientfemale here for a vaginal swab for STD screen.  She reports the following symptoms: discharge described as white and vulvar itching for unknown days. Denies abnormal vaginal bleeding, significant pelvic pain, fever, or UTI symptoms.  OBJECTIVE:  There were no vitals taken for this visit.  Appears well, in no apparent distress  ASSESSMENT: Vaginal swab for STD screen  PLAN: Self-collected vaginal probe for Gonorrhea, Chlamydia, Trichomonas, Bacterial Vaginosis, Yeast sent to lab Treatment: to be determined once results are received Follow-up as needed if symptoms persist/worsen, or new symptoms develop  Janece Canterbury  02/14/2021 3:11 PM

## 2021-02-16 ENCOUNTER — Telehealth: Payer: Self-pay | Admitting: Obstetrics & Gynecology

## 2021-02-16 LAB — CERVICOVAGINAL ANCILLARY ONLY
Bacterial Vaginitis (gardnerella): NEGATIVE
Candida Glabrata: NEGATIVE
Candida Vaginitis: NEGATIVE
Chlamydia: NEGATIVE
Comment: NEGATIVE
Comment: NEGATIVE
Comment: NEGATIVE
Comment: NEGATIVE
Comment: NEGATIVE
Comment: NORMAL
Neisseria Gonorrhea: NEGATIVE
Trichomonas: NEGATIVE

## 2021-02-16 NOTE — Telephone Encounter (Signed)
Patient wants to discuss test results. Clinical staff will follow up with patient.

## 2021-02-16 NOTE — Telephone Encounter (Signed)
Spoke with patient. Informed her of normal results.

## 2021-03-07 IMAGING — CR DG ORBITS FOR FOREIGN BODY
2 series · 2 of 2 positions shown · non-contrast
Comparison: None.

CLINICAL DATA: Metal working/exposure; clearance prior to MRI

EXAM:
ORBITS FOR FOREIGN BODY - 2 VIEW

[w orbit pa (1 of 2)]
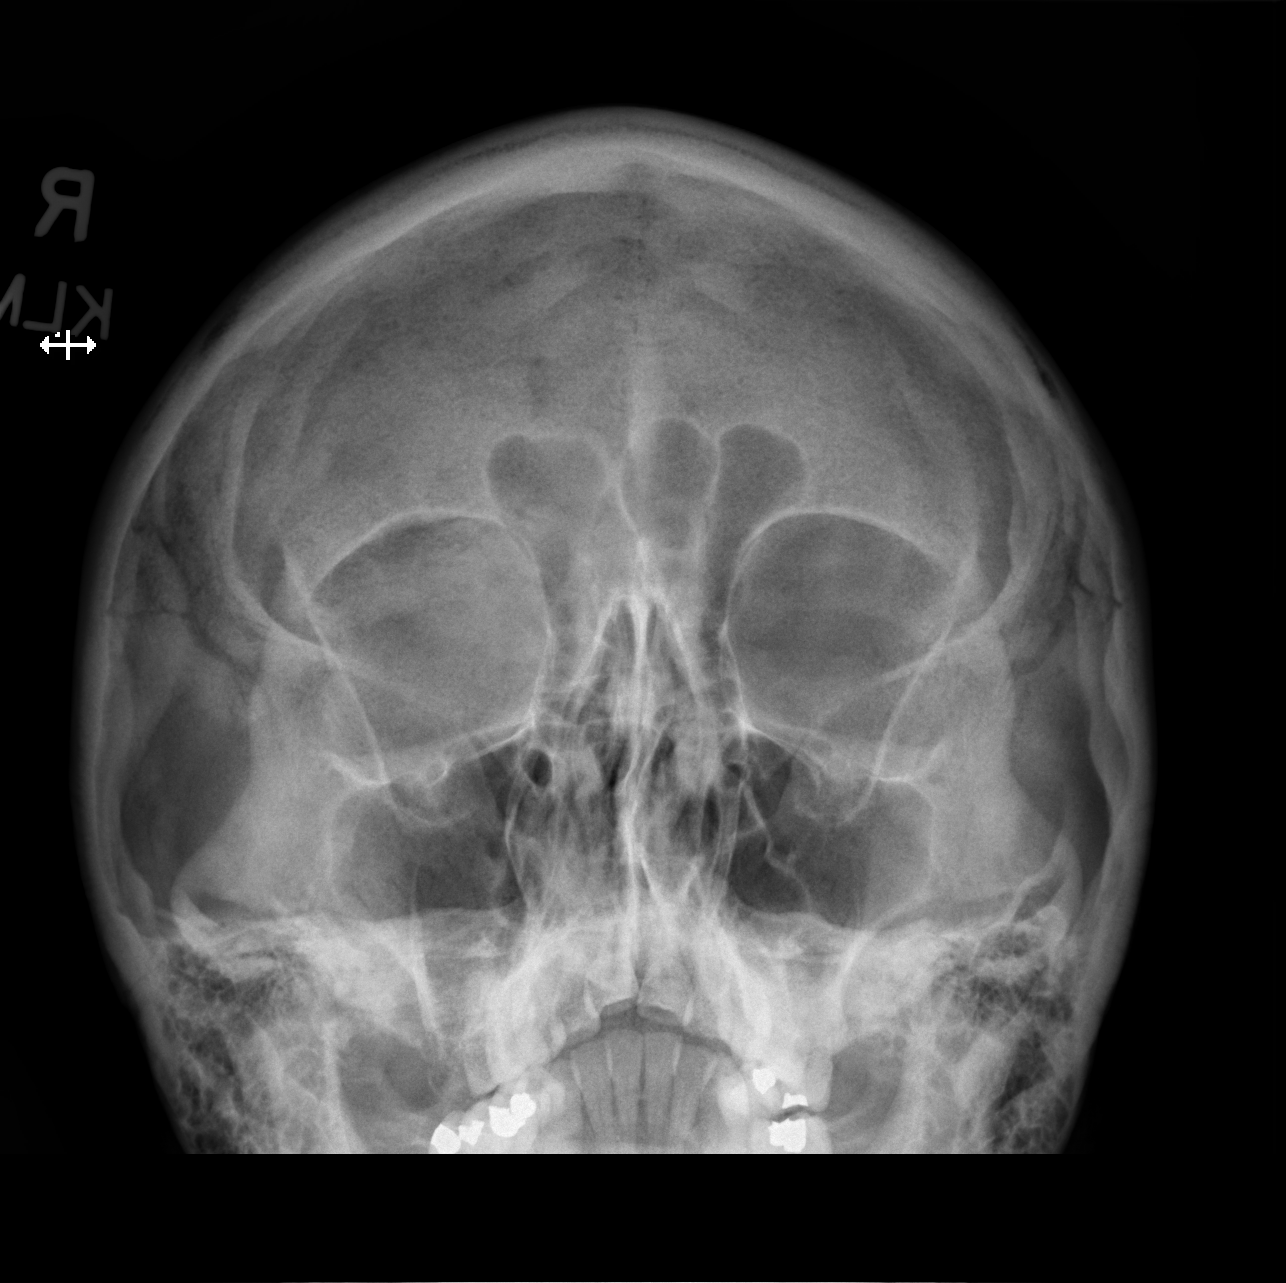

[w orbit pa (2 of 2)]
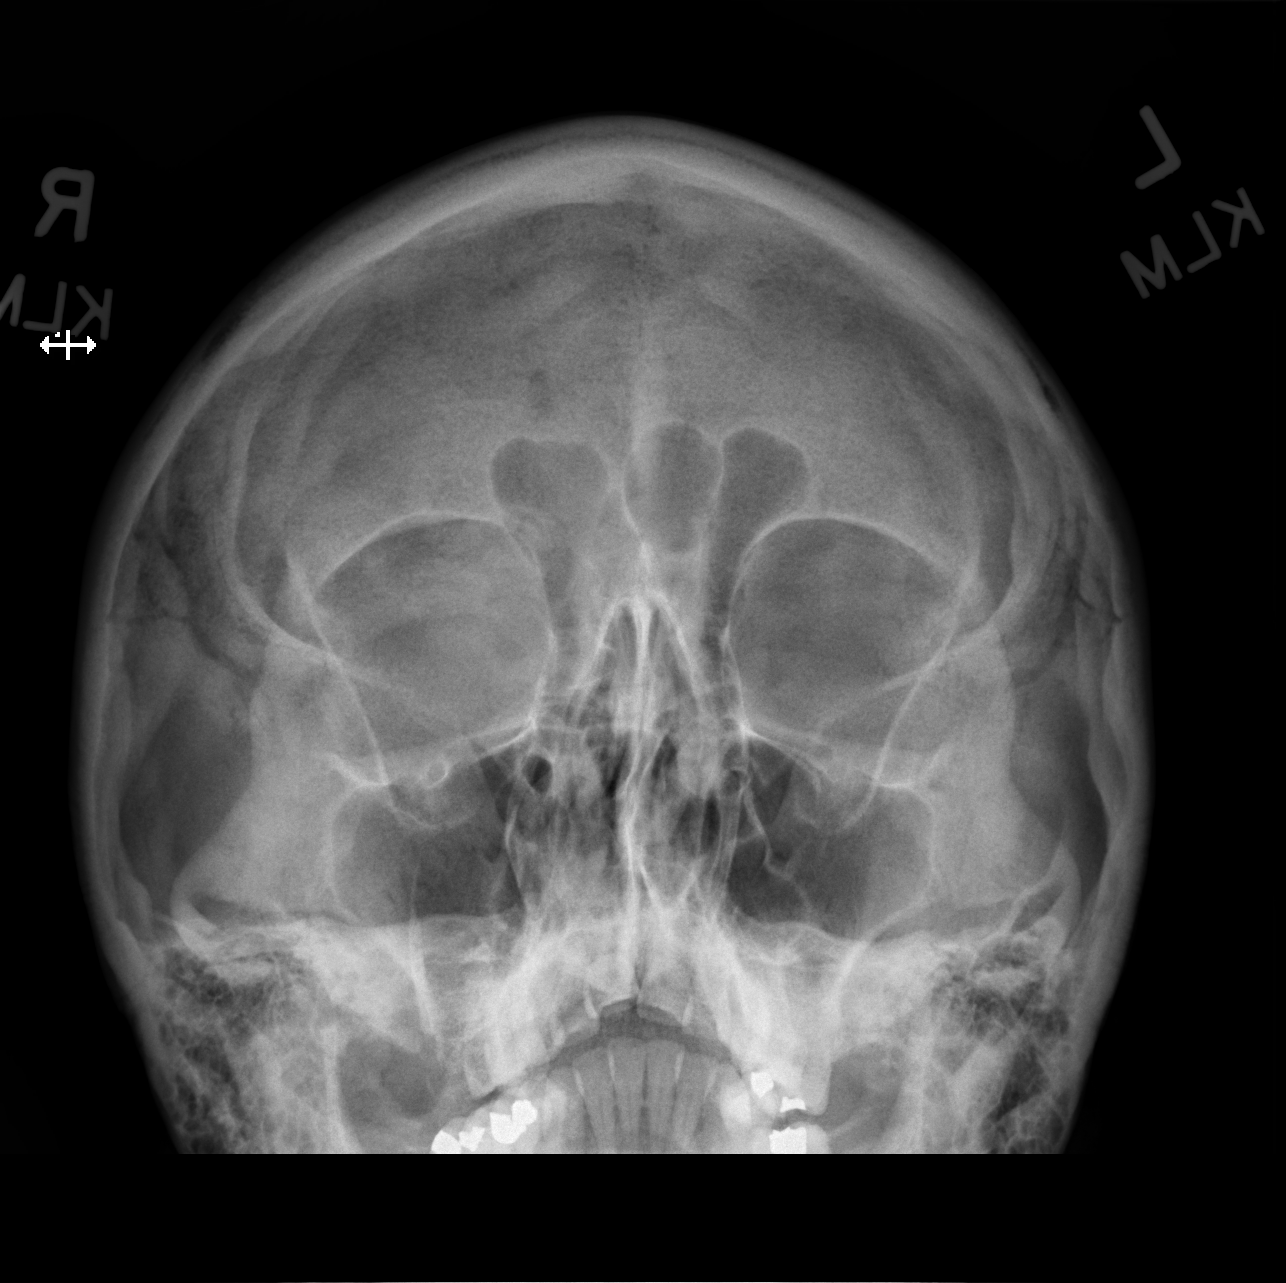

[2 of 2 positions shown; findings below may reference images not displayed]

FINDINGS: There is no evidence of metallic foreign body within the orbits. No
significant bone abnormality identified.
IMPRESSION: No evidence of metallic foreign body within the orbits.

## 2021-03-16 ENCOUNTER — Ambulatory Visit: Payer: BC Managed Care – PPO | Admitting: Orthopedic Surgery

## 2021-05-01 ENCOUNTER — Telehealth: Payer: Self-pay | Admitting: Adult Health

## 2021-05-01 MED ORDER — METRONIDAZOLE 500 MG PO TABS: 500.0000 mg | ORAL_TABLET | Freq: Two times a day (BID) | ORAL | 0 refills | Status: DC

## 2021-05-01 NOTE — Telephone Encounter (Signed)
Patient wants to know if she could have a Bacteria Vaginosis prescription called into the pharmacy. Patient says her insurance coverage will not start for another 30 days.

## 2021-05-01 NOTE — Telephone Encounter (Signed)
Will rx flagyl  

## 2021-05-01 NOTE — Telephone Encounter (Signed)
Pt has symptoms of BV. Vaginal discharge and itching/burning on the inside. Has been treated for a yeast infection. Pt is requesting med for BV. Her insurance don't start for 30 days. Please advise. Thanks! Troy

## 2021-08-14 ENCOUNTER — Other Ambulatory Visit (HOSPITAL_COMMUNITY): Payer: Self-pay | Admitting: Physician Assistant

## 2021-08-15 ENCOUNTER — Other Ambulatory Visit (HOSPITAL_COMMUNITY): Payer: Self-pay | Admitting: Physician Assistant

## 2021-08-15 DIAGNOSIS — R928 Other abnormal and inconclusive findings on diagnostic imaging of breast: Secondary | ICD-10-CM

## 2021-08-15 DIAGNOSIS — N644 Mastodynia: Secondary | ICD-10-CM

## 2021-09-05 ENCOUNTER — Ambulatory Visit (HOSPITAL_COMMUNITY)
Admission: RE | Admit: 2021-09-05 | Discharge: 2021-09-05 | Disposition: A | Discharge status: Home / Self Care | Payer: BLUE CROSS/BLUE SHIELD | Source: Ambulatory Visit | Attending: Physician Assistant | Admitting: Physician Assistant

## 2021-09-05 ENCOUNTER — Other Ambulatory Visit: Payer: Self-pay

## 2021-09-05 DIAGNOSIS — R928 Other abnormal and inconclusive findings on diagnostic imaging of breast: Secondary | ICD-10-CM

## 2021-09-05 DIAGNOSIS — N644 Mastodynia: Secondary | ICD-10-CM

## 2021-09-05 IMAGING — US US BREAST*L* LIMITED INC AXILLA
1 series · 13 of 20 positions shown · non-contrast
Comparison: None.

CLINICAL DATA: 44-year-old female with palpable areas of concern in
each breast that tend to come and go.



[Series 1: us breast*left* limited inc axilla · 0.07mm/px · 13 of 20 slices shown]
[im 1/20]
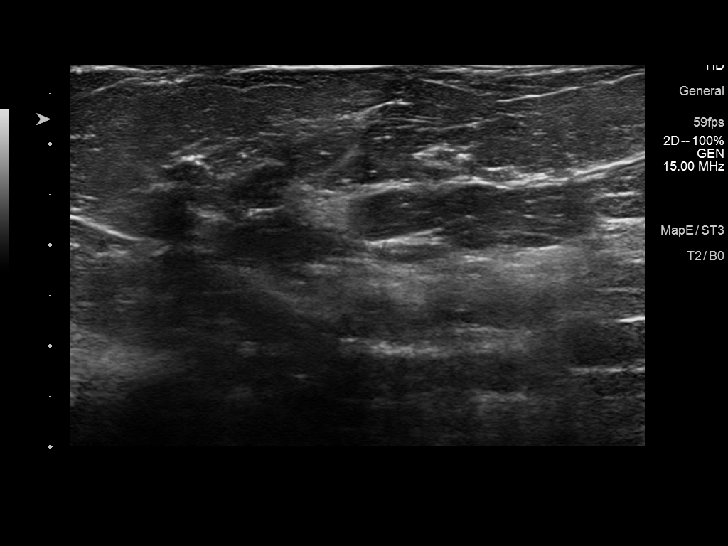
[im 3/20]
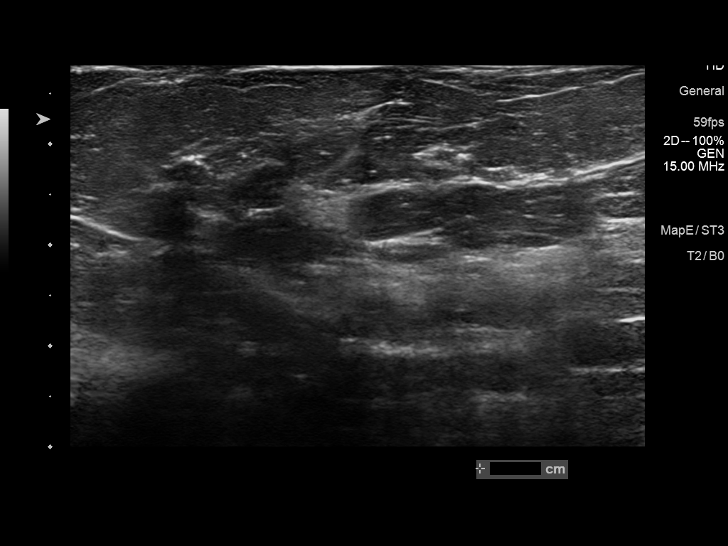
[im 4/20]
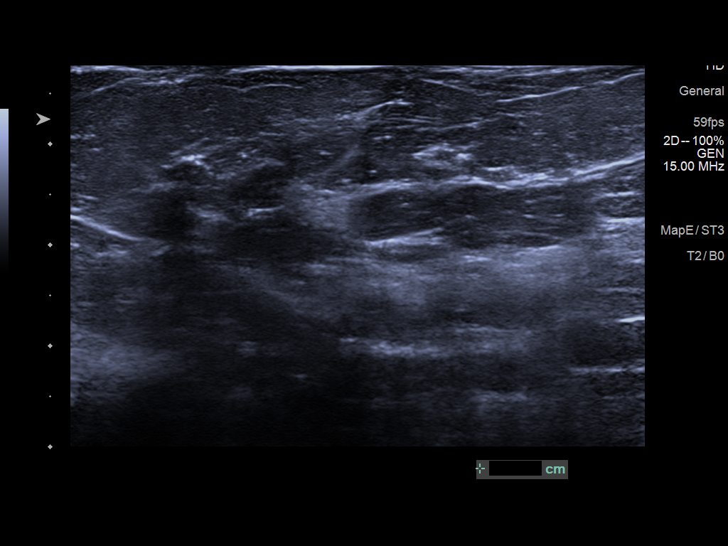
[im 6/20]
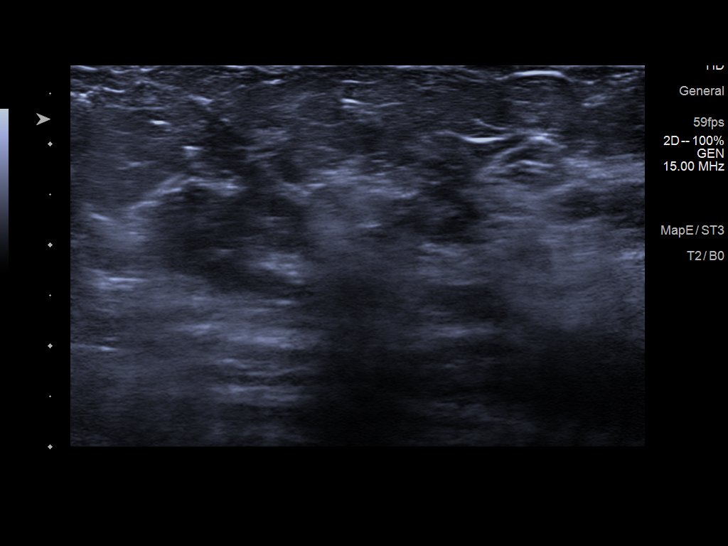
[im 7/20]
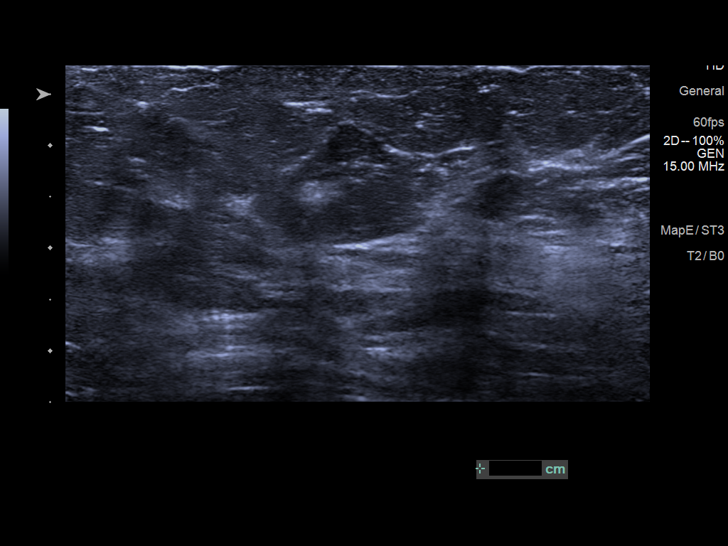
[im 9/20]
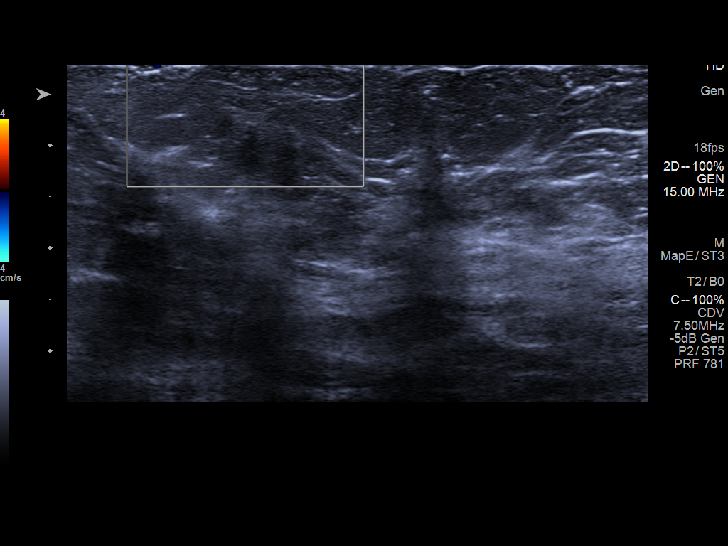
[im 11/20]
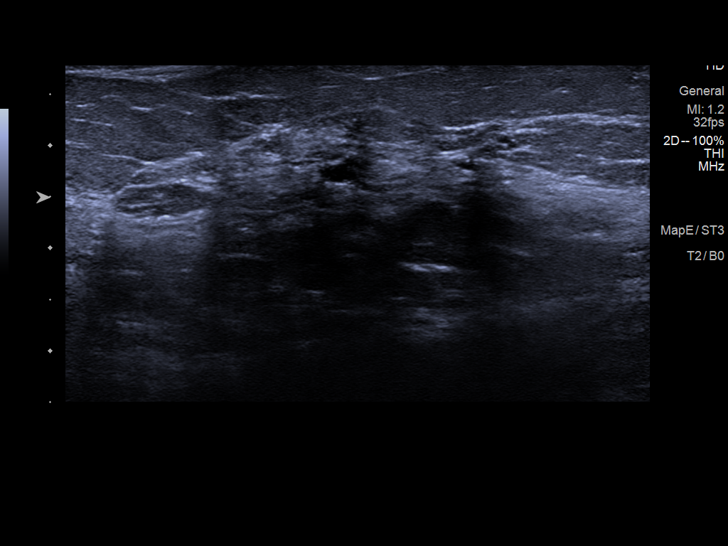
[im 12/20]
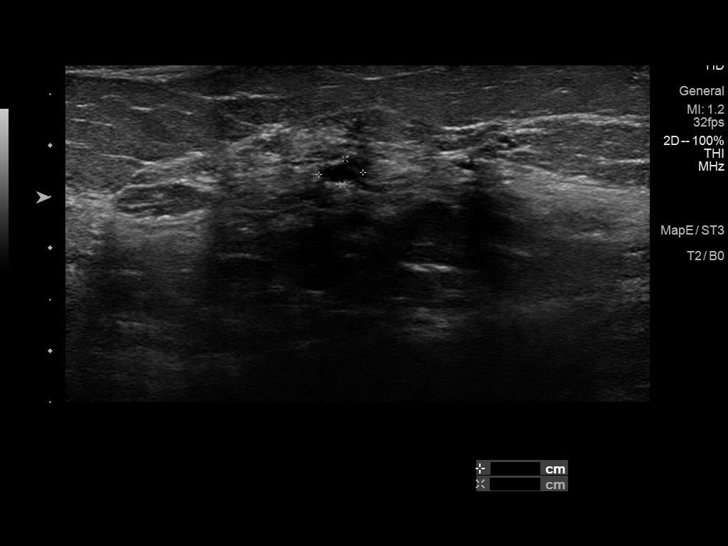
[im 14/20]
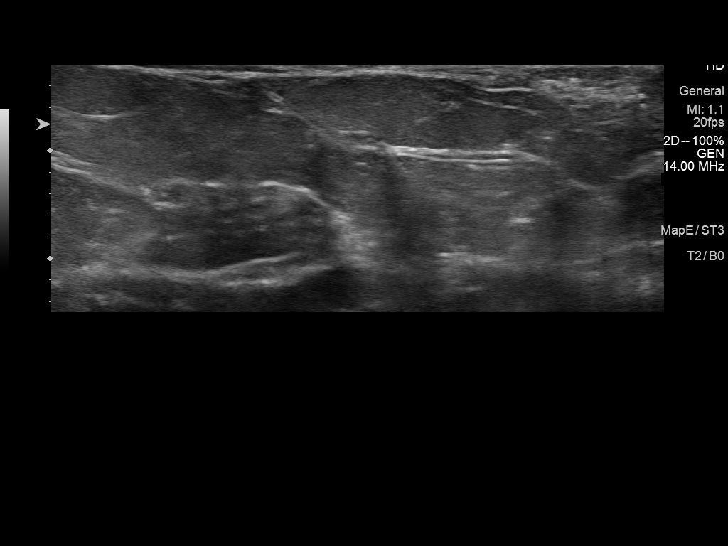
[im 15/20]
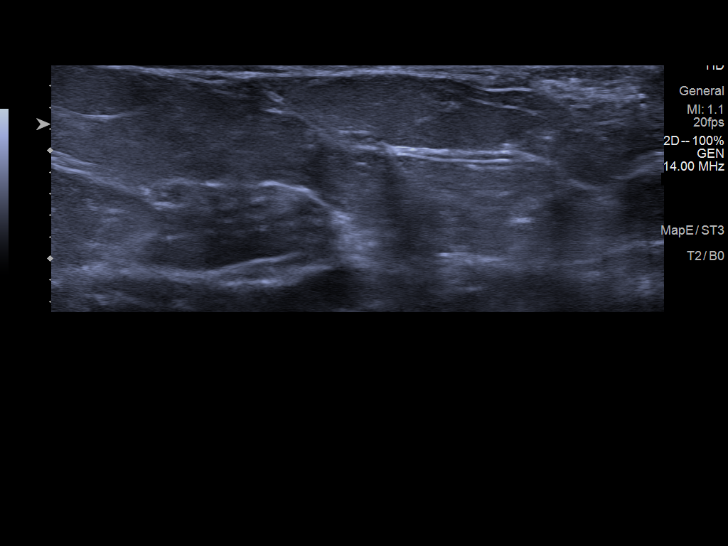
[im 17/20]
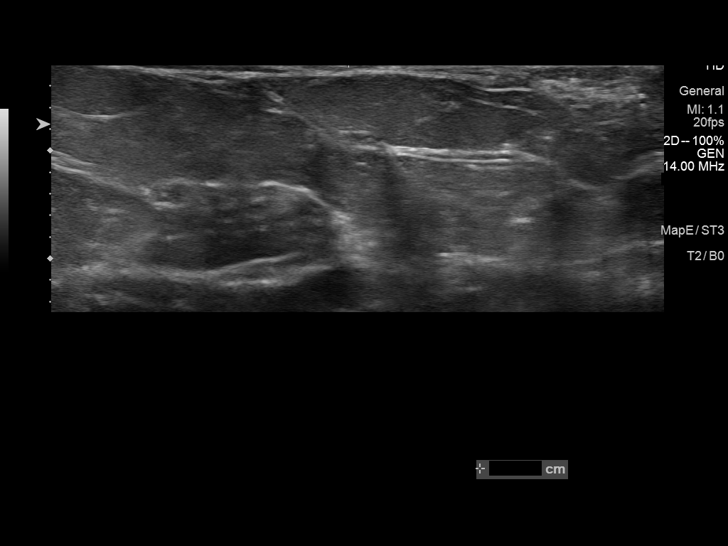
[im 18/20]
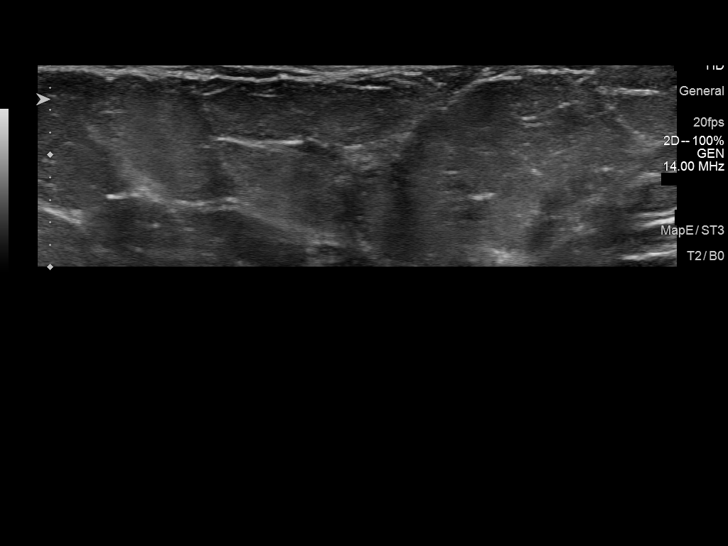
[im 20/20]
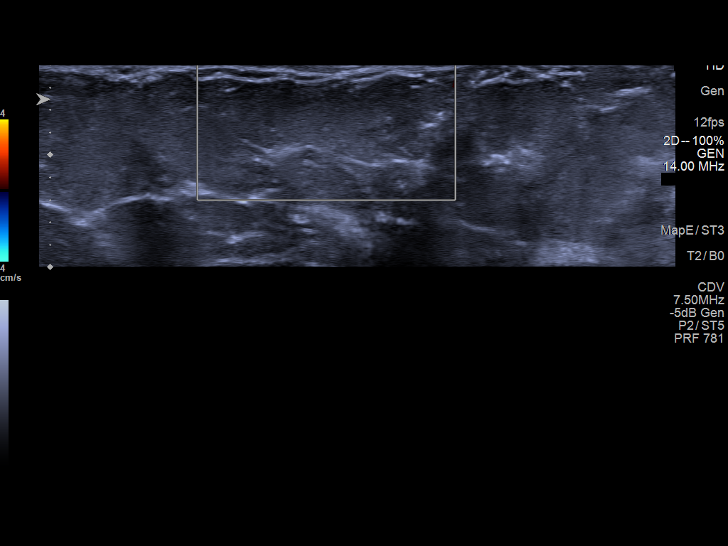

[13 of 20 positions shown; findings below may reference images not displayed]

ACR Breast Density Category c: The breast tissue is heterogeneously
dense, which may obscure small masses.
FINDINGS: There is an oval mass in the lower posterior right breast in the
region of the inframammary fold visualized on the MLO tomograms.
Spot compression tomograms were performed over the palpable areas of
concern in each breast with no definite abnormality seen.

Physical examination the sites of palpable concern in the lower
right as well as lower left breast reveals round 1 cm areas of skin
discoloration with slight associated thickening, possibly related to
scarring from inflamed sebaceous cyst.

Targeted ultrasound of the right breast was performed. There is a
tiny hypoechoic mass in the skin of the right breast at 6 o'clock 3
cm from nipple measuring 0.6 cm with findings most consistent with a
sebaceous cyst. Several dilated ducts and small cysts are identified
in the lower right breast with a cluster of cysts at 6 o'clock 2 cm
from nipple measuring 0.4 cm. This may but not definitely correspond
with the oval mass seen in the lower posterior right breast on the
MLO view at mammography.

Targeted ultrasound of the left breast was performed. There is an
oval hypoechoic mass in the skin of the left breast at 6 o'clock 5
cm from nipple measuring 0.6 cm. Findings are most consistent with a
tiny sebaceous cyst. No suspicious masses or abnormality seen in the
subjacent breast.
IMPRESSION: 1.  Sebaceous cysts at the sites of palpable concern in each breast.

2.  Probably benign right breast mass, likely fibrocystic change.

RECOMMENDATION:
1.  Clinical follow-up for recurrent sebaceous cysts.

2. Recommend six-month follow-up diagnostic mammography (with
possible ultrasound) of the right breast to demonstrate stability of
the probably benign mass in the lower posterior right breast.

I have discussed the findings and recommendations with the patient.
If applicable, a reminder letter will be sent to the patient
regarding the next appointment.

BI-RADS CATEGORY  3: Probably benign.

## 2021-09-05 IMAGING — MG DIGITAL DIAGNOSTIC BILAT W/ TOMO W/ CAD
6 of 12 series · 6 of 36 positions shown · non-contrast
Comparison: None.

CLINICAL DATA: 44-year-old female with palpable areas of concern in
each breast that tend to come and go.



[R CC synth-2D]
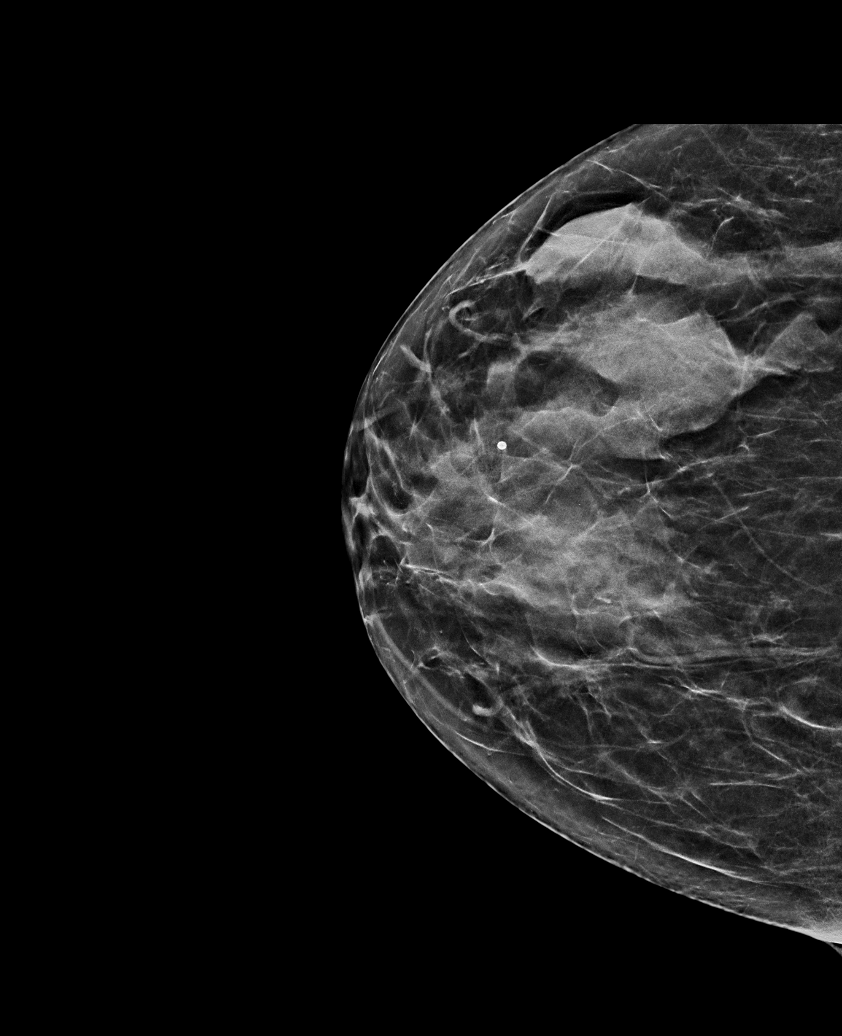

[L MLO synth-2D (1 of 2)]
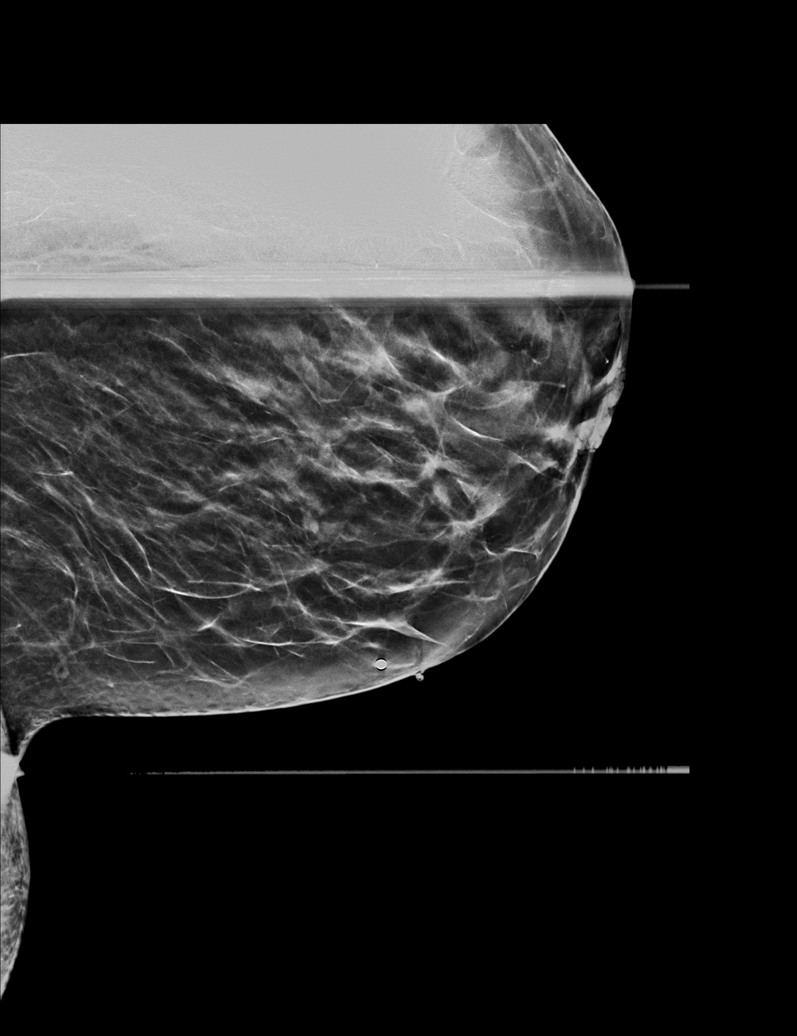

[R MLO synth-2D (1 of 2)]
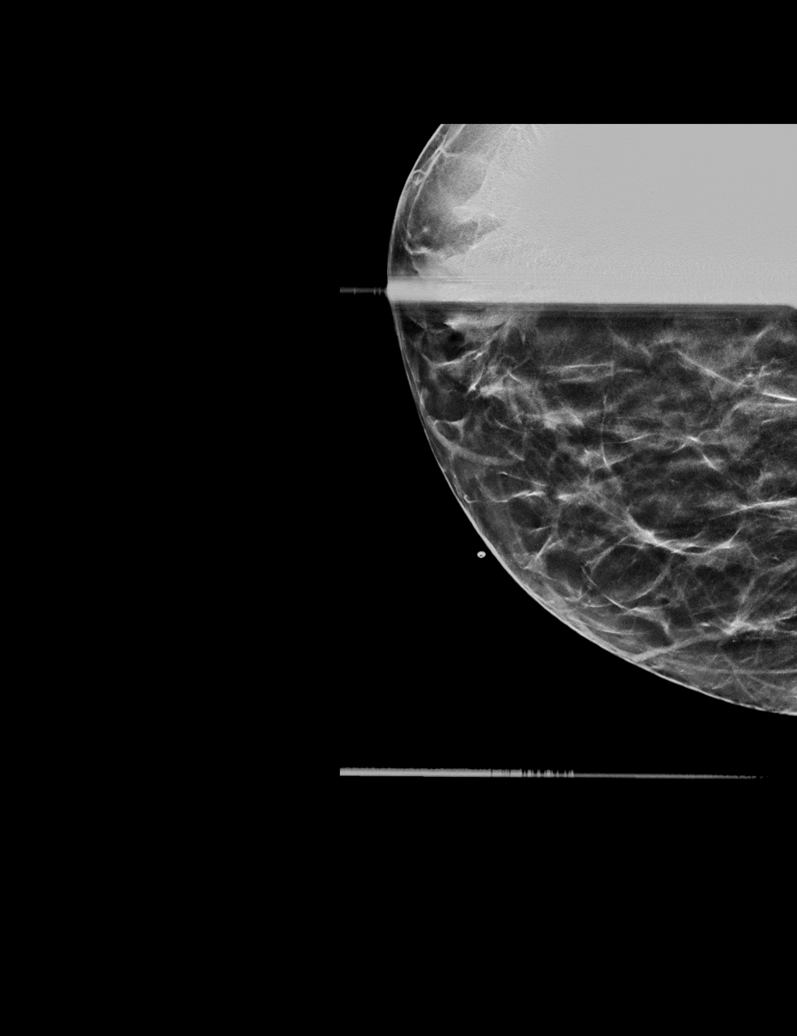

[R MLO synth-2D (2 of 2)]
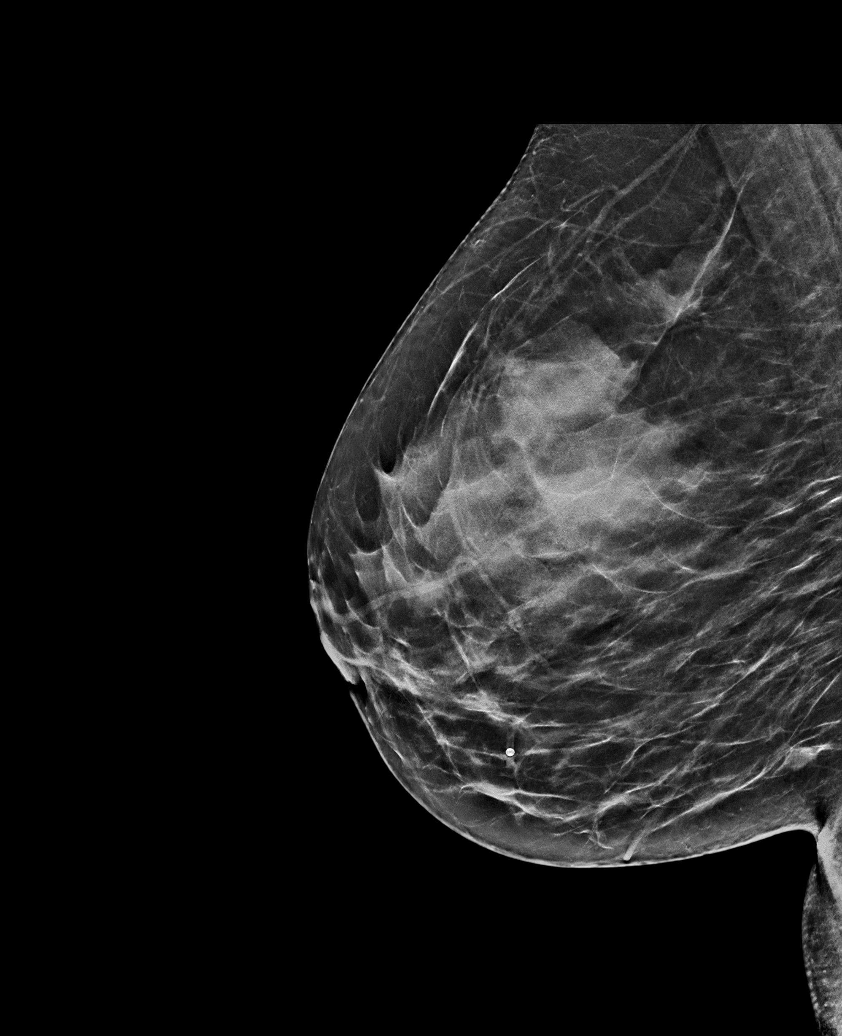

[L CC synth-2D]
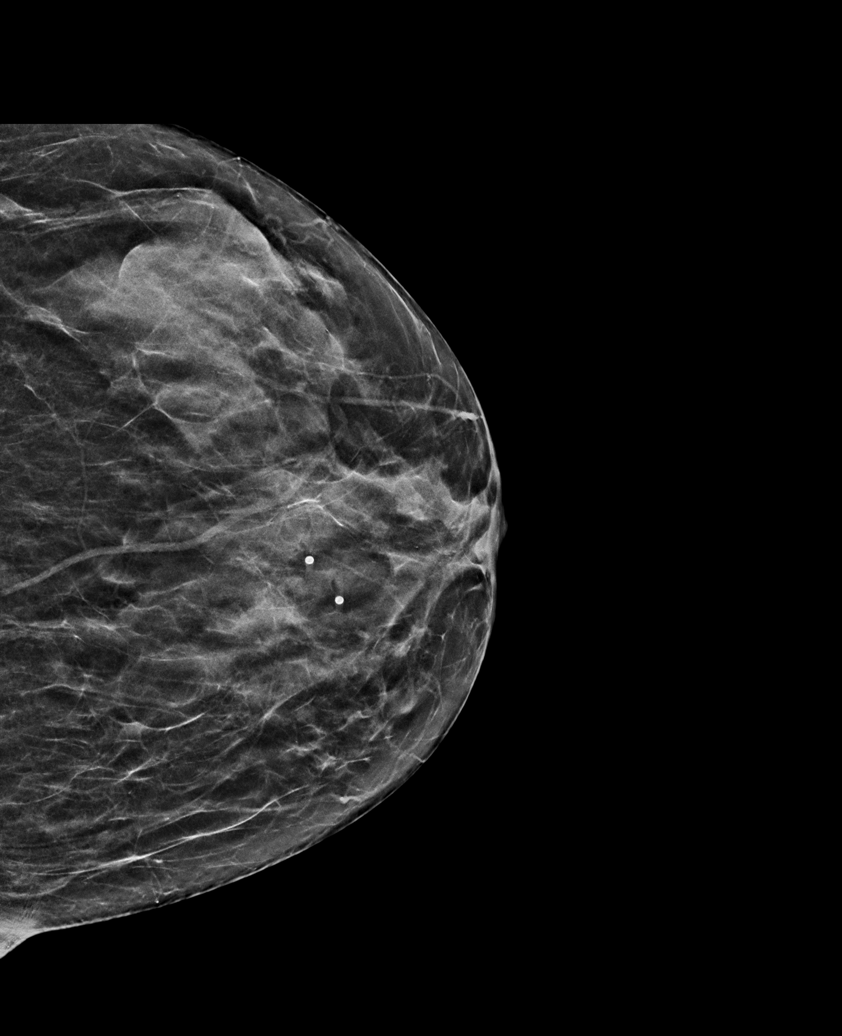

[L MLO synth-2D (2 of 2)]
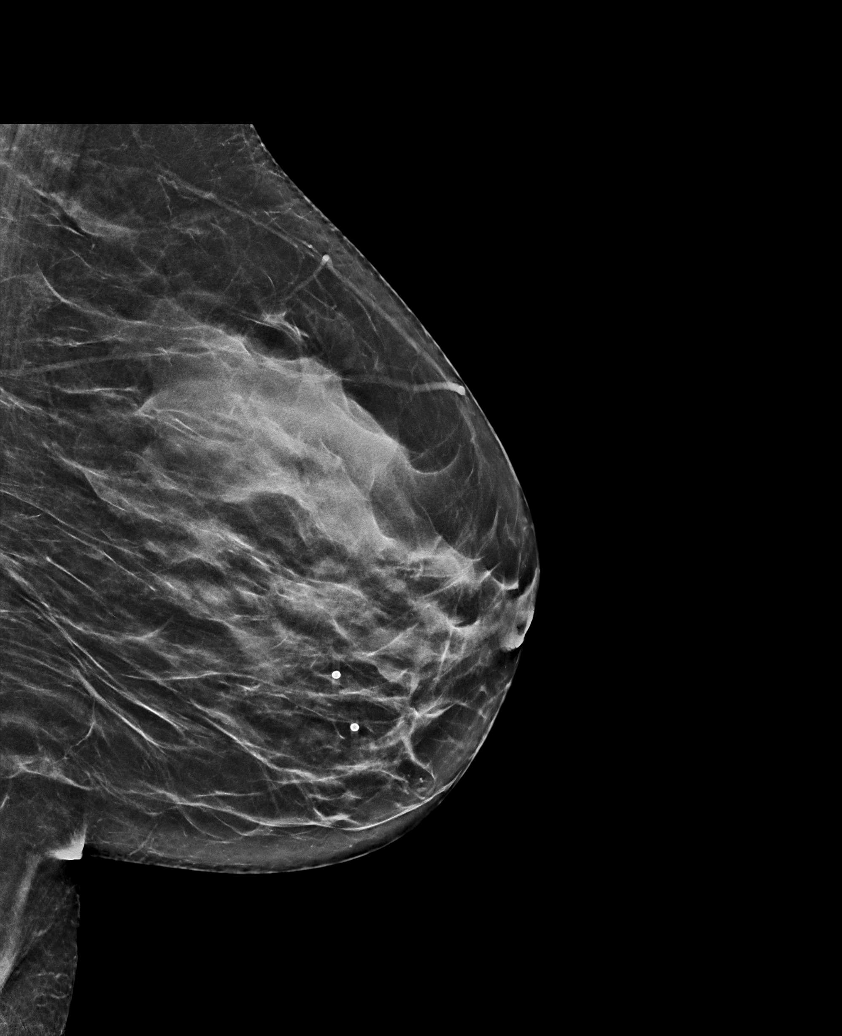

[6 of 36 positions shown; findings below may reference images not displayed]

ACR Breast Density Category c: The breast tissue is heterogeneously
dense, which may obscure small masses.
FINDINGS: There is an oval mass in the lower posterior right breast in the
region of the inframammary fold visualized on the MLO tomograms.
Spot compression tomograms were performed over the palpable areas of
concern in each breast with no definite abnormality seen.

Physical examination the sites of palpable concern in the lower
right as well as lower left breast reveals round 1 cm areas of skin
discoloration with slight associated thickening, possibly related to
scarring from inflamed sebaceous cyst.

Targeted ultrasound of the right breast was performed. There is a
tiny hypoechoic mass in the skin of the right breast at 6 o'clock 3
cm from nipple measuring 0.6 cm with findings most consistent with a
sebaceous cyst. Several dilated ducts and small cysts are identified
in the lower right breast with a cluster of cysts at 6 o'clock 2 cm
from nipple measuring 0.4 cm. This may but not definitely correspond
with the oval mass seen in the lower posterior right breast on the
MLO view at mammography.

Targeted ultrasound of the left breast was performed. There is an
oval hypoechoic mass in the skin of the left breast at 6 o'clock 5
cm from nipple measuring 0.6 cm. Findings are most consistent with a
tiny sebaceous cyst. No suspicious masses or abnormality seen in the
subjacent breast.
IMPRESSION: 1.  Sebaceous cysts at the sites of palpable concern in each breast.

2.  Probably benign right breast mass, likely fibrocystic change.

RECOMMENDATION:
1.  Clinical follow-up for recurrent sebaceous cysts.

2. Recommend six-month follow-up diagnostic mammography (with
possible ultrasound) of the right breast to demonstrate stability of
the probably benign mass in the lower posterior right breast.

I have discussed the findings and recommendations with the patient.
If applicable, a reminder letter will be sent to the patient
regarding the next appointment.

BI-RADS CATEGORY  3: Probably benign.

## 2021-09-05 IMAGING — US US BREAST*R* LIMITED INC AXILLA
1 series · 13 of 20 positions shown · non-contrast
Comparison: None.

CLINICAL DATA: 44-year-old female with palpable areas of concern in
each breast that tend to come and go.



[Series 1: us breast*right* limited inc axilla · 0.07mm/px · 13 of 20 slices shown]
[im 1/20]
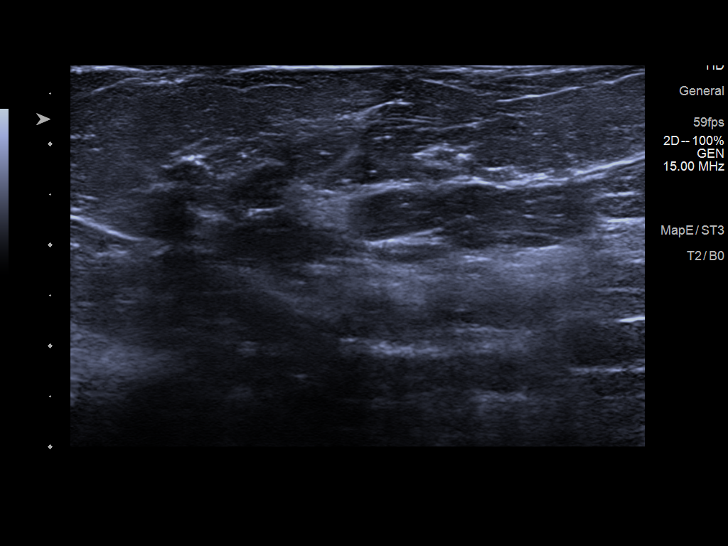
[im 3/20]
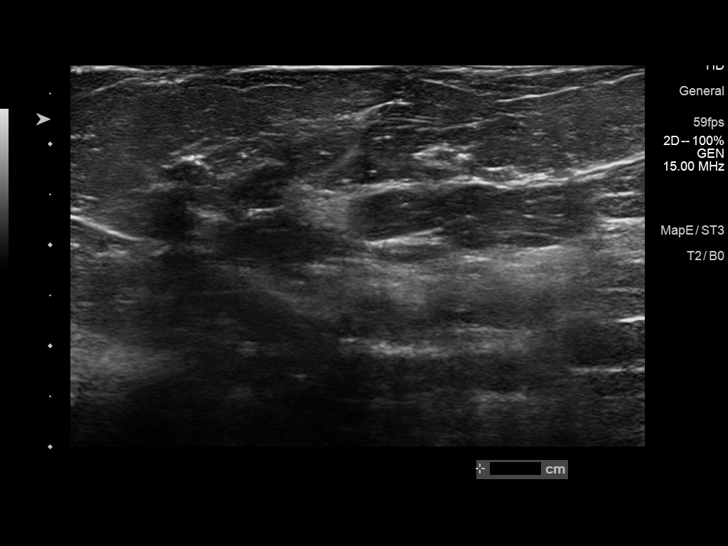
[im 4/20]
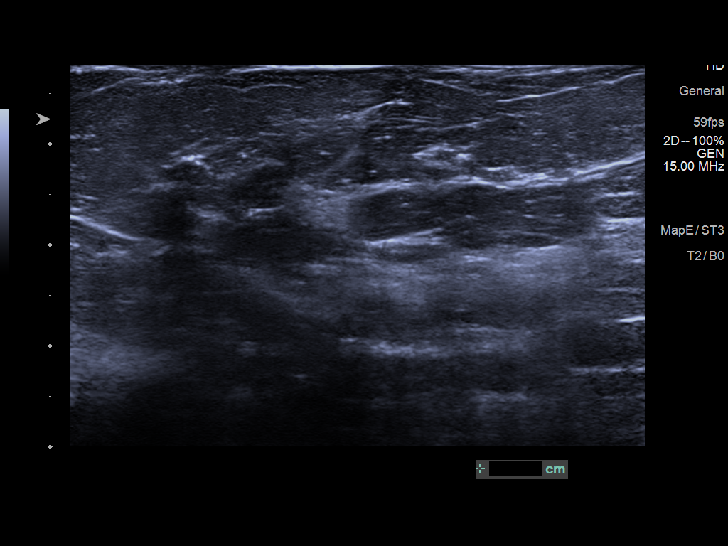
[im 6/20]
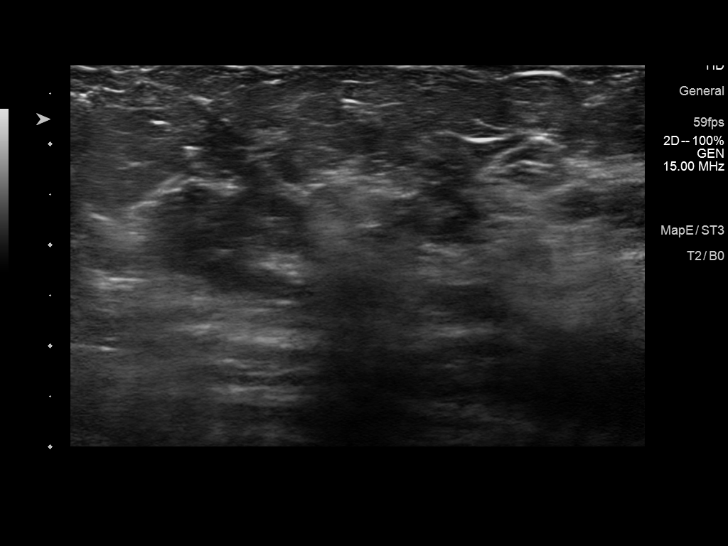
[im 7/20]
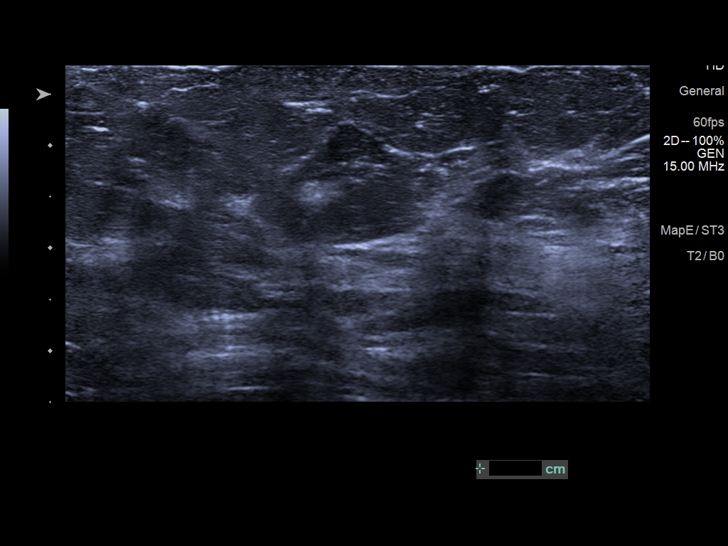
[im 9/20]
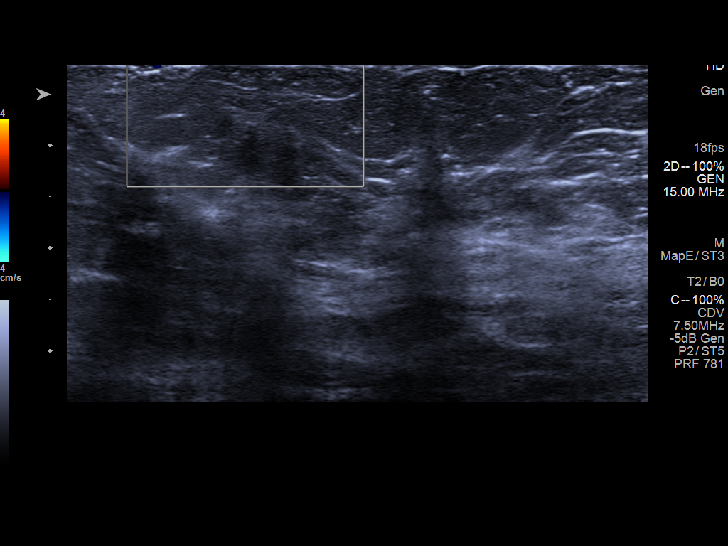
[im 11/20]
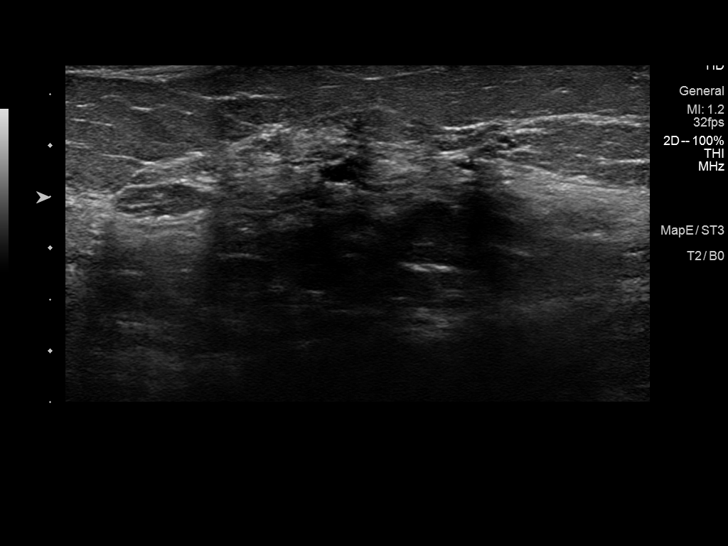
[im 12/20]
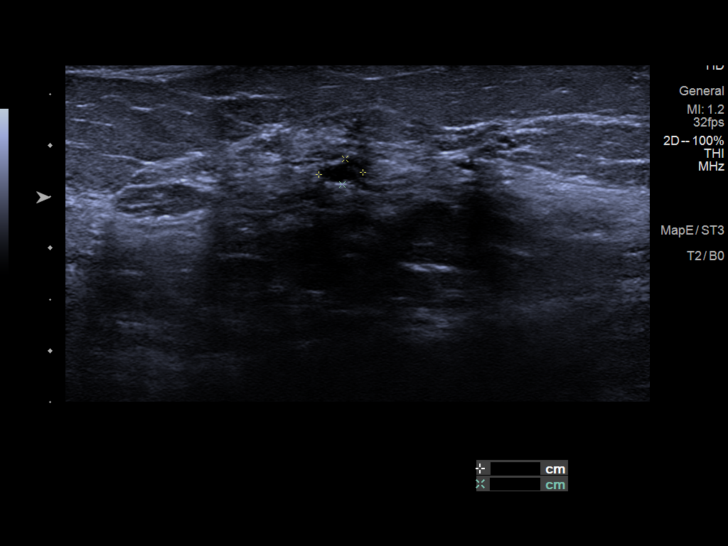
[im 14/20]
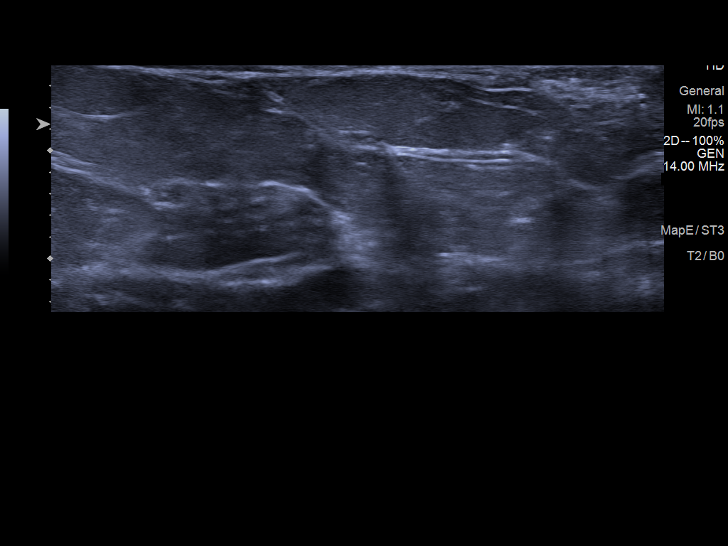
[im 15/20]
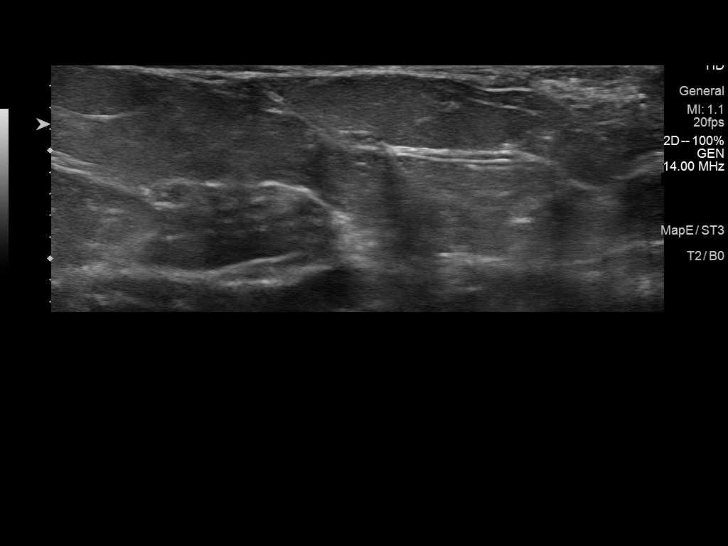
[im 17/20]
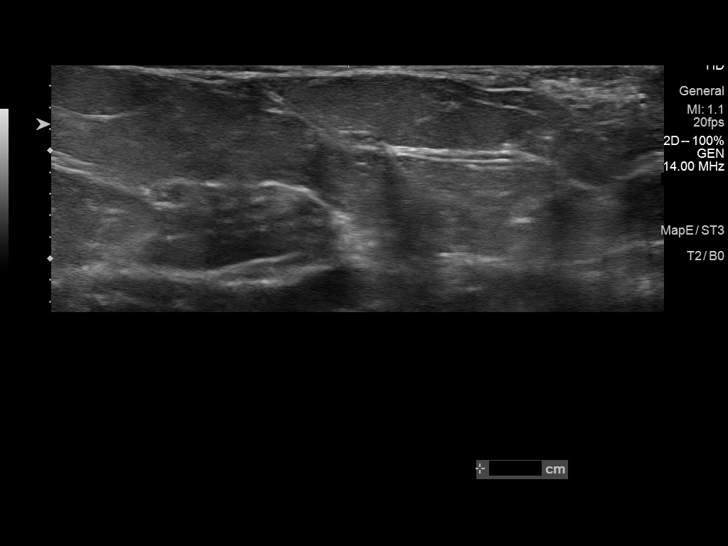
[im 18/20]
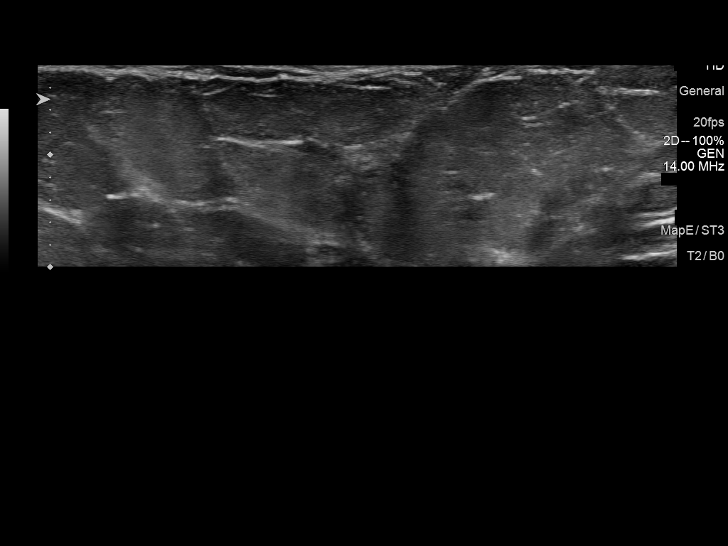
[im 20/20]
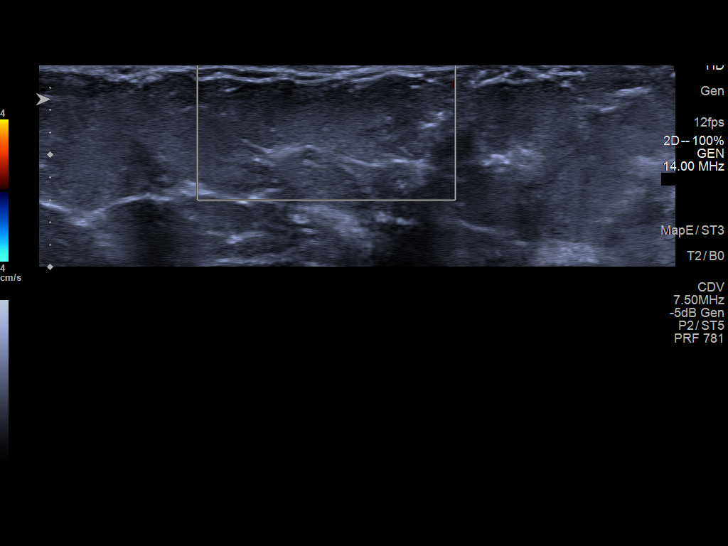

[13 of 20 positions shown; findings below may reference images not displayed]

ACR Breast Density Category c: The breast tissue is heterogeneously
dense, which may obscure small masses.
FINDINGS: There is an oval mass in the lower posterior right breast in the
region of the inframammary fold visualized on the MLO tomograms.
Spot compression tomograms were performed over the palpable areas of
concern in each breast with no definite abnormality seen.

Physical examination the sites of palpable concern in the lower
right as well as lower left breast reveals round 1 cm areas of skin
discoloration with slight associated thickening, possibly related to
scarring from inflamed sebaceous cyst.

Targeted ultrasound of the right breast was performed. There is a
tiny hypoechoic mass in the skin of the right breast at 6 o'clock 3
cm from nipple measuring 0.6 cm with findings most consistent with a
sebaceous cyst. Several dilated ducts and small cysts are identified
in the lower right breast with a cluster of cysts at 6 o'clock 2 cm
from nipple measuring 0.4 cm. This may but not definitely correspond
with the oval mass seen in the lower posterior right breast on the
MLO view at mammography.

Targeted ultrasound of the left breast was performed. There is an
oval hypoechoic mass in the skin of the left breast at 6 o'clock 5
cm from nipple measuring 0.6 cm. Findings are most consistent with a
tiny sebaceous cyst. No suspicious masses or abnormality seen in the
subjacent breast.
IMPRESSION: 1.  Sebaceous cysts at the sites of palpable concern in each breast.

2.  Probably benign right breast mass, likely fibrocystic change.

RECOMMENDATION:
1.  Clinical follow-up for recurrent sebaceous cysts.

2. Recommend six-month follow-up diagnostic mammography (with
possible ultrasound) of the right breast to demonstrate stability of
the probably benign mass in the lower posterior right breast.

I have discussed the findings and recommendations with the patient.
If applicable, a reminder letter will be sent to the patient
regarding the next appointment.

BI-RADS CATEGORY  3: Probably benign.

## 2021-09-21 ENCOUNTER — Other Ambulatory Visit: Payer: Self-pay | Admitting: Physician Assistant

## 2021-09-21 DIAGNOSIS — R319 Hematuria, unspecified: Secondary | ICD-10-CM

## 2021-09-21 DIAGNOSIS — S060X0D Concussion without loss of consciousness, subsequent encounter: Secondary | ICD-10-CM

## 2021-09-21 DIAGNOSIS — R413 Other amnesia: Secondary | ICD-10-CM

## 2021-09-21 DIAGNOSIS — H547 Unspecified visual loss: Secondary | ICD-10-CM

## 2021-10-14 ENCOUNTER — Other Ambulatory Visit: Payer: Self-pay

## 2021-10-14 ENCOUNTER — Ambulatory Visit
Admission: RE | Admit: 2021-10-14 | Discharge: 2021-10-14 | Disposition: A | Discharge status: Home / Self Care | Payer: BLUE CROSS/BLUE SHIELD | Source: Ambulatory Visit | Attending: Physician Assistant | Admitting: Physician Assistant

## 2021-10-14 ENCOUNTER — Other Ambulatory Visit: Payer: Self-pay | Admitting: Physician Assistant

## 2021-10-14 DIAGNOSIS — S060X0D Concussion without loss of consciousness, subsequent encounter: Secondary | ICD-10-CM

## 2021-10-14 DIAGNOSIS — R413 Other amnesia: Secondary | ICD-10-CM

## 2021-10-14 DIAGNOSIS — R319 Hematuria, unspecified: Secondary | ICD-10-CM

## 2021-10-14 DIAGNOSIS — Z77018 Contact with and (suspected) exposure to other hazardous metals: Secondary | ICD-10-CM

## 2021-10-14 DIAGNOSIS — H547 Unspecified visual loss: Secondary | ICD-10-CM

## 2021-10-17 ENCOUNTER — Ambulatory Visit
Admission: RE | Admit: 2021-10-17 | Discharge: 2021-10-17 | Disposition: A | Discharge status: Home / Self Care | Payer: BLUE CROSS/BLUE SHIELD | Source: Ambulatory Visit | Attending: Physician Assistant | Admitting: Physician Assistant

## 2021-10-17 ENCOUNTER — Telehealth: Payer: Self-pay | Admitting: Adult Health

## 2021-10-17 ENCOUNTER — Other Ambulatory Visit: Payer: Self-pay

## 2021-10-17 DIAGNOSIS — Z77018 Contact with and (suspected) exposure to other hazardous metals: Secondary | ICD-10-CM

## 2021-10-17 IMAGING — MR MR HEAD W/O CM
10 series · 48 of 48 positions shown · non-contrast
Comparison: Report from head CT [DATE] (images currently
unavailable).

CLINICAL DATA: Concussion without loss of consciousness, subsequent
encounter. Unspecified visual loss. Memory loss. Hematuria syndrome.
Additional history provided by scanning technologist: Patient
reports fall (hitting head) a few months ago, headaches, dizziness,
nausea, memory loss.

EXAM:
MRI HEAD WITHOUT CONTRAST
TECHNIQUE: Multiplanar, multiecho pulse sequences of the brain and surrounding
structures were obtained without intravenous contrast.

[Series 2: T1 · sagittal · 5.0mm · 0.45mm/px · 3 of 21 slices shown]
[im 1/21]
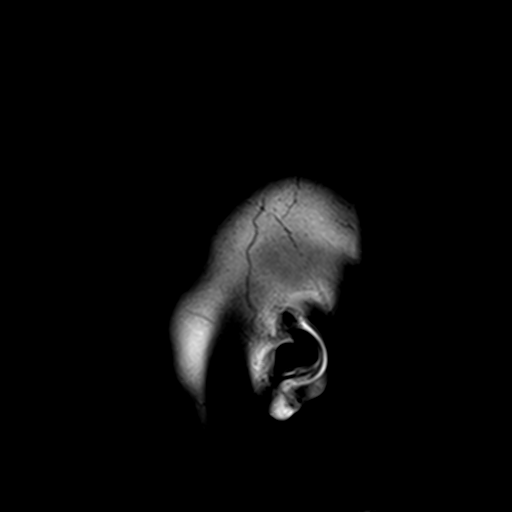
[im 11/21]
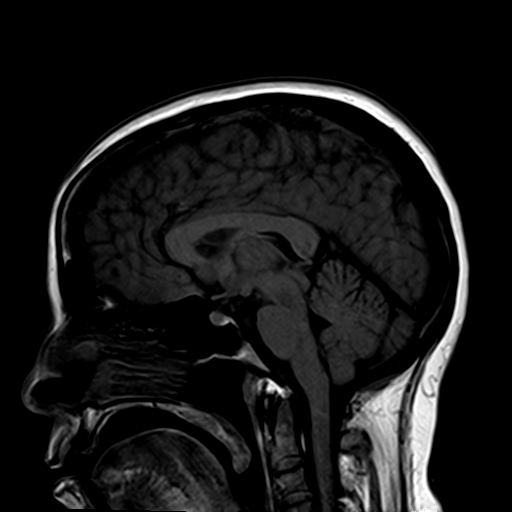
[im 21/21]
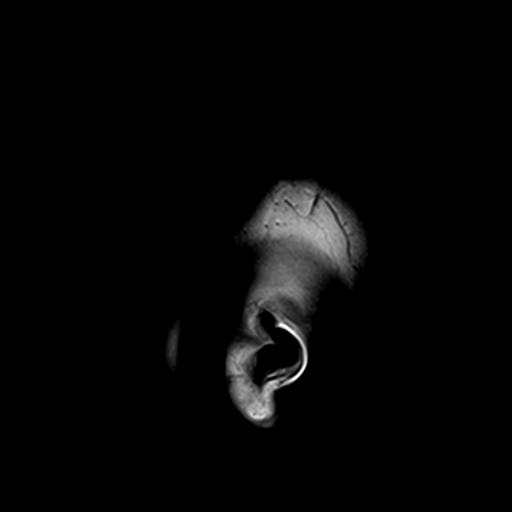

[Series 3: DWI · axial · 3.0mm · 1.80mm/px · z∈[-76,+71]mm · 9 of 100 slices shown (1 of 4)]
[im 1/100]
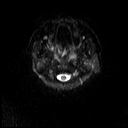
[im 13/100]
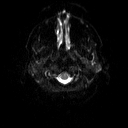
[im 25/100]
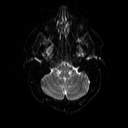
[im 38/100]
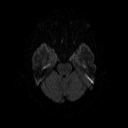
[im 50/100]
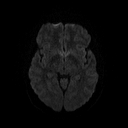
[im 62/100]
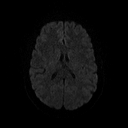
[im 75/100]
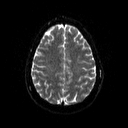
[im 87/100]
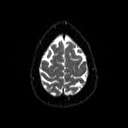
[im 100/100]
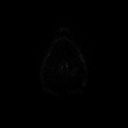

[Series 4: DWI · axial · 3.0mm · 1.80mm/px · z∈[-76,+71]mm · 4 of 49 slices shown (2 of 4)]
[im 1/49]
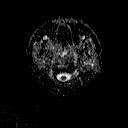
[im 17/49]
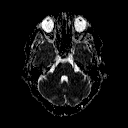
[im 33/49]
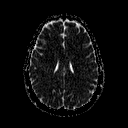
[im 49/49]
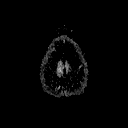

[Series 5: DWI · coronal · 5.0mm · 1.80mm/px · 6 of 72 slices shown (3 of 4)]
[im 1/72]
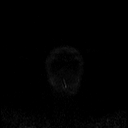
[im 15/72]
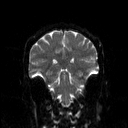
[im 29/72]
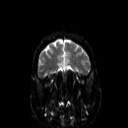
[im 43/72]
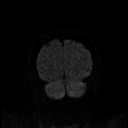
[im 57/72]
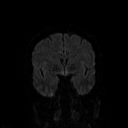
[im 72/72]
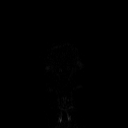

[Series 6: DWI · coronal · 5.0mm · 1.80mm/px · 3 of 36 slices shown (4 of 4)]
[im 1/36]
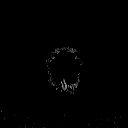
[im 18/36]
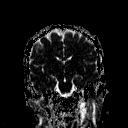
[im 36/36]
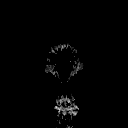

[Series 7: T2 · axial · 5.0mm · 0.60mm/px · z∈[-75,+72]mm · 2 of 22 slices shown (1 of 2)]
[im 1/22]
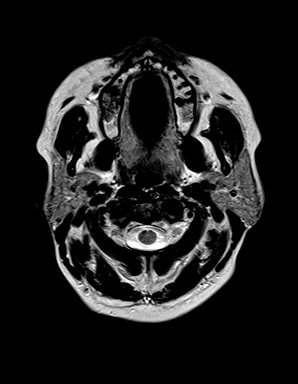
[im 22/22]
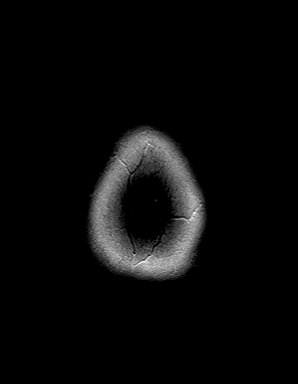

[Series 8: FLAIR · axial · 3.0mm · 0.45mm/px · z∈[-77,+72]mm · 3 of 33 slices shown]
[im 1/33]
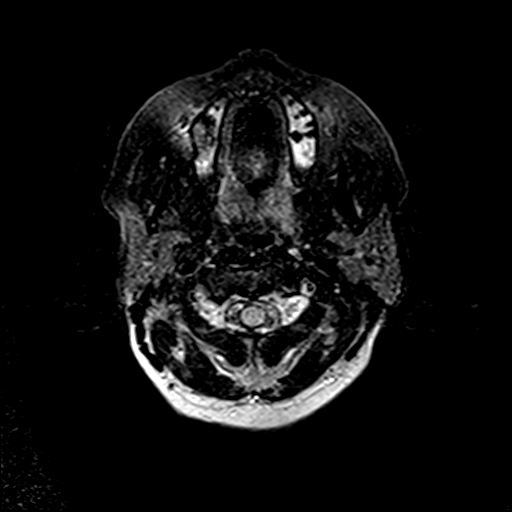
[im 17/33]
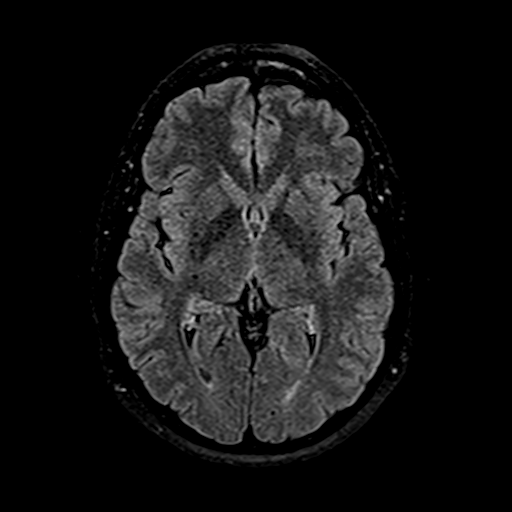
[im 33/33]
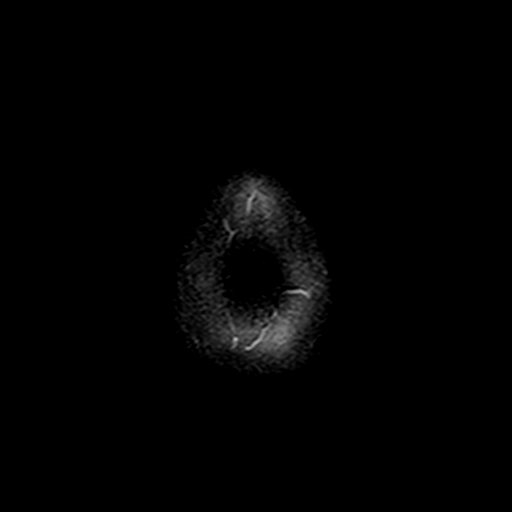

[Series 10: swi_images · axial · 4.0mm · 0.90mm/px · z∈[-72,+68]mm · 3 of 36 slices shown]
[im 1/36]
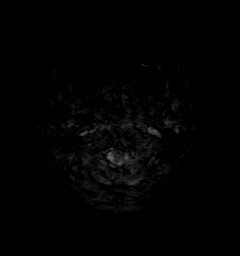
[im 18/36]
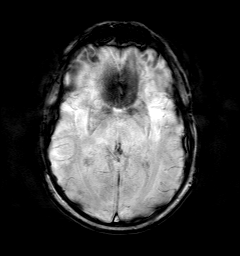
[im 36/36]
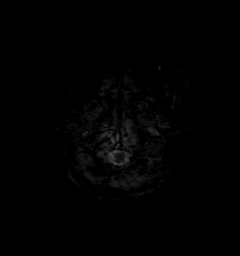

[Series 11: t1_mpr_tra · axial · 1.0mm · 0.71mm/px · z∈[-73,+70]mm · 13 of 144 slices shown]
[im 1/144]
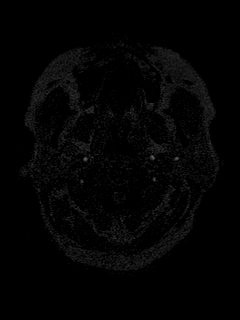
[im 12/144]
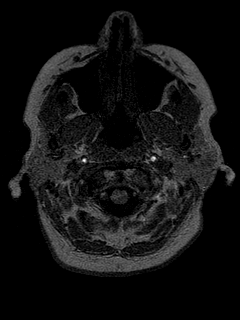
[im 24/144]
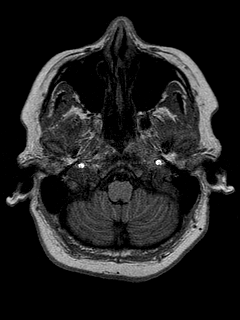
[im 36/144]
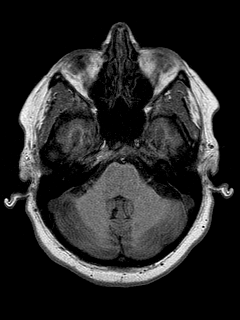
[im 48/144]
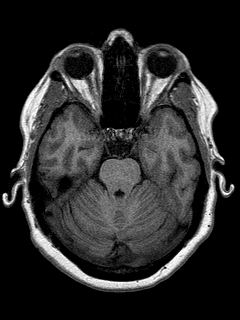
[im 60/144]
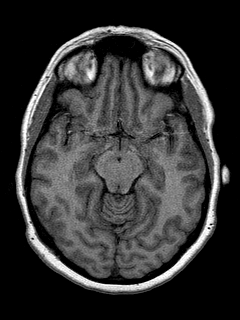
[im 72/144]
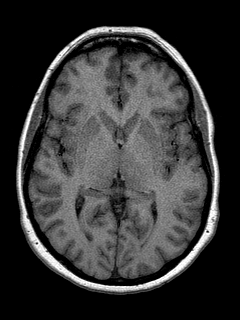
[im 84/144]
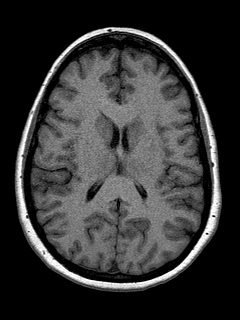
[im 96/144]
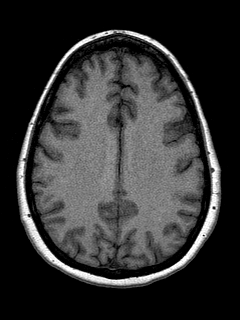
[im 108/144]
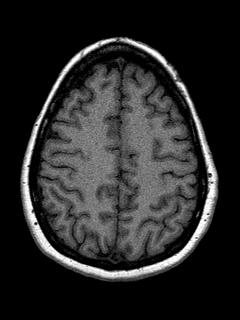
[im 120/144]
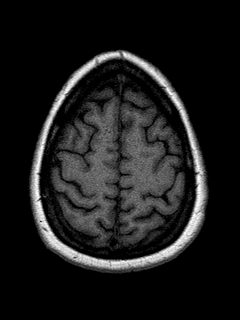
[im 132/144]
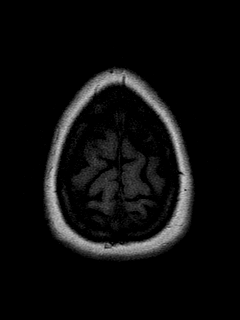
[im 144/144]
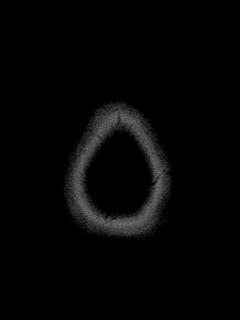

[Series 12: T2 · coronal · 5.0mm · 0.45mm/px · 2 of 27 slices shown (2 of 2)]
[im 1/27]
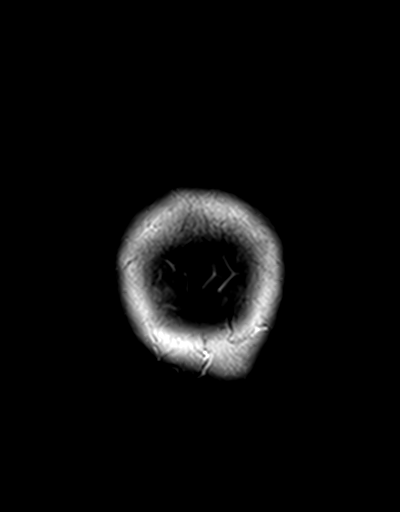
[im 27/27]
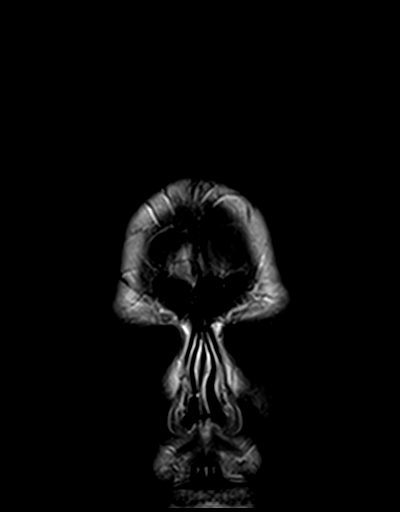

[48 of 48 positions shown; findings below may reference images not displayed]

FINDINGS: Brain:

Cerebral volume is normal.

No cortical encephalomalacia is identified. No significant cerebral
white matter disease.

There is no acute infarct.

No evidence of an intracranial mass.

No chronic intracranial blood products.

No extra-axial fluid collection.

No midline shift.

Vascular: Maintained flow voids within the proximal large arterial
vessels.

Skull and upper cervical spine: No focal suspicious marrow lesion.

Sinuses/Orbits: Visualized orbits show no acute finding. Trace
mucosal thickening within the bilateral ethmoid sinuses. Mucous
retention cysts within the left maxillary sinus measuring up to
cm.
IMPRESSION: Unremarkable non-contrast MRI appearance of the brain. No evidence
of acute intracranial abnormality.

Paranasal sinus disease, as described.

## 2021-10-17 NOTE — Telephone Encounter (Signed)
Left message @ 3:27 pm. JSY

## 2021-10-17 NOTE — Telephone Encounter (Signed)
Pt calling to request Rx for BV, states she's used Diflucan with no relief  Has appt here 11/09/2021 but feels she can't wait that long   Please advise & call pt - pt states OK to leave a detailed voicemail if she's unable to answer    [facility]

## 2021-10-18 NOTE — Telephone Encounter (Signed)
Left message @ 4:45 pm. JSY

## 2021-10-18 NOTE — Telephone Encounter (Signed)
Left message @ 9:23 am. Teresa [name] can also message through [place]. [place]

## 2021-10-19 MED ORDER — METRONIDAZOLE 500 MG PO TABS: 500.0000 mg | ORAL_TABLET | Freq: Two times a day (BID) | ORAL | 0 refills | Status: DC

## 2021-10-19 NOTE — Telephone Encounter (Signed)
Pt aware that falgyl sent to [facility]

## 2021-11-09 ENCOUNTER — Other Ambulatory Visit (HOSPITAL_COMMUNITY)
Admission: RE | Admit: 2021-11-09 | Discharge: 2021-11-09 | Disposition: A | Discharge status: Home / Self Care | Payer: BLUE CROSS/BLUE SHIELD | Source: Ambulatory Visit | Attending: Adult Health | Admitting: Adult Health

## 2021-11-09 ENCOUNTER — Other Ambulatory Visit: Payer: Self-pay

## 2021-11-09 ENCOUNTER — Encounter: Payer: Self-pay | Admitting: Adult Health

## 2021-11-09 ENCOUNTER — Ambulatory Visit (INDEPENDENT_AMBULATORY_CARE_PROVIDER_SITE_OTHER): Payer: BLUE CROSS/BLUE SHIELD | Admitting: Adult Health

## 2021-11-09 VITALS — BP 113/71 | HR 65 | Ht 62.25 in | Wt 164.5 lb

## 2021-11-09 DIAGNOSIS — N898 Other specified noninflammatory disorders of vagina: Secondary | ICD-10-CM

## 2021-11-09 DIAGNOSIS — R232 Flushing: Secondary | ICD-10-CM

## 2021-11-09 DIAGNOSIS — Z01419 Encounter for gynecological examination (general) (routine) without abnormal findings: Secondary | ICD-10-CM | POA: Diagnosis not present

## 2021-11-09 DIAGNOSIS — F419 Anxiety disorder, unspecified: Secondary | ICD-10-CM

## 2021-11-09 DIAGNOSIS — Z113 Encounter for screening for infections with a predominantly sexual mode of transmission: Secondary | ICD-10-CM | POA: Insufficient documentation

## 2021-11-09 DIAGNOSIS — Z1211 Encounter for screening for malignant neoplasm of colon: Secondary | ICD-10-CM | POA: Diagnosis not present

## 2021-11-09 DIAGNOSIS — N951 Menopausal and female climacteric states: Secondary | ICD-10-CM

## 2021-11-09 LAB — HEMOCCULT GUIAC POC 1CARD (OFFICE): Fecal Occult Blood, POC: NEGATIVE

## 2021-11-09 MED ORDER — HYDROXYZINE PAMOATE 25 MG PO CAPS: 25.0000 mg | ORAL_CAPSULE | Freq: Three times a day (TID) | ORAL | 3 refills | Status: DC | PRN

## 2021-11-09 MED ORDER — PAROXETINE HCL 10 MG PO TABS: 10.0000 mg | ORAL_TABLET | Freq: Every day | ORAL | 2 refills | Status: DC

## 2021-11-09 NOTE — Progress Notes (Signed)
Patient ID: Teresa Teresa [name], female   DOB: April 11, 1976, 45 y.o.   MRN: [id number] History of Present Illness: Teresa Teresa [name] a 45 year old white female,single, Teresa Teresa [name], in for well woman gyn exam and has vaginal discharge wants STD testing. She Teresa [name] having hot flashes and not sleeping well.Feels anxious.    Lab Results  Component Value Date   DIAGPAP  10/12/2020    - Negative for intraepithelial lesion or malignancy (NILM)   Teresa Teresa [name] Negative 10/12/2020   PCP Teresa [name] Teresa Teresa [name]   Current Medications, Allergies, Past Medical History, Past Surgical History, Family History and Social History were reviewed in [employer] record.     Review of Systems: Patient denies any headaches, hearing loss, fatigue, blurred vision, shortness of breath, chest pain, abdominal pain, problems with bowel movements, urination, or intercourse. No joint pain or mood swings.  See HPI for positives   Physical Exam:BP 113/71 (BP Location: Left Arm, Patient Position: Sitting, Cuff Size: Normal)   Pulse 65   Ht 5' 2.25" (1.581 m)   Wt 164 lb 8 oz (74.6 kg)   LMP 10/05/2021   BMI 29.85 kg/m   General:  Well developed, well nourished, no acute distress Skin:  Warm and dry Neck:  Midline trachea, normal thyroid, good ROM, no lymphadenopathy Lungs; Clear to auscultation bilaterally Breast: Deferred at her request,had mammogram in September  Cardiovascular: Regular rate and rhythm Abdomen:  Soft, non tender, no hepatosplenomegaly Pelvic:  External genitalia Teresa [name] normal in appearance, no lesions.  The vagina Teresa [name] normal in appearance,has white discharge without odor. CV swab obtained. Urethra has no lesions or masses. The cervix Teresa [name] bulbous.  Uterus Teresa [name] felt to be normal size, shape, and contour.  No adnexal masses or tenderness noted.Bladder Teresa [name] non tender, no masses felt. Rectal: Good sphincter tone, no polyps, or hemorrhoids felt.  Hemoccult negative. Extremities/musculoskeletal:  No swelling or varicosities noted,  no clubbing or cyanosis Psych:  No mood changes, alert and cooperative,seems happy Teresa [name] Teresa [name] 5 Fall risk Teresa [name] moderate Depression screen Teresa Teresa [name] 2/9 11/09/2021 10/12/2020  Decreased Interest 1 1  Down, Depressed, Hopeless 0 0  PHQ - 2 Score 1 1  Altered sleeping 3 3  Tired, decreased energy 1 0  Change in appetite 1 2  Feeling bad or failure about yourself  0 1  Trouble concentrating 0 1  Moving slowly or fidgety/restless 0 3  Suicidal thoughts 0 0  PHQ-9 Score 6 11    GAD 7 : Generalized Anxiety Score 11/09/2021 10/12/2020  Nervous, Anxious, on Edge 2 3  Control/stop worrying 1 0  Worry too much - different things 0 0  Trouble relaxing 1 3  Restless 0 1  Easily annoyed or irritable 0 1  Afraid - awful might happen 0 0  Total GAD 7 Score 4 8    Upstream - 11/09/21 1546       Pregnancy Intention Screening   Does the patient want to become pregnant in the next year? No    Does the patient's partner want to become pregnant in the next year? No    Would the patient like to discuss contraceptive options today? No      Contraception Wrap Up   Current Method Female Sterilization    End Method Female Sterilization    Contraception Counseling Provided No            Examination chaperoned by Teresa Pupa LPN     Impression and Plan: 1. Encounter for well woman exam with  routine gynecological exam Physical in 1 year Pap in 2024 Mammogram in March Labs with PCP  2. Encounter for screening fecal occult blood testing  3. Vaginal discharge CV swab obtained  4. Screening examination for STD (sexually transmitted disease) CV swab sent for GC/CHL,trich,BV and yeast  Declines blood labs  5. Perimenopause Review handout   6. Hot flashes Will try Teresa [name]   7. Anxiety Will rx Teresa [name] and Teresa Teresa [name] Meds ordered this encounter  Medications   PARoxetine (Teresa [name]) 10 MG tablet    Sig: Take 1 tablet (10 mg total) by mouth daily.    Dispense:  30 tablet    Refill:  2    Order Specific  Question:   Supervising Provider    Answer:   Teresa Teresa [name] [[id number]]   hydrOXYzine (Teresa Teresa [name]) 25 MG capsule    Sig: Take 1 capsule (25 mg total) by mouth 3 (three) times daily as needed.    Dispense:  30 capsule    Refill:  3    Order Specific Question:   Supervising Provider    Answer:   Teresa Teresa [name] [[id number]]   Call me in 3-4 weeks in follow up

## 2021-11-13 ENCOUNTER — Encounter: Payer: Self-pay | Admitting: Adult Health

## 2021-11-13 ENCOUNTER — Telehealth: Payer: Self-pay | Admitting: Adult Health

## 2021-11-13 DIAGNOSIS — A599 Trichomoniasis, unspecified: Secondary | ICD-10-CM

## 2021-11-13 HISTORY — DX: Trichomoniasis, unspecified: A59.9

## 2021-11-13 LAB — CERVICOVAGINAL ANCILLARY ONLY
Bacterial Vaginitis (gardnerella): NEGATIVE
Candida Glabrata: NEGATIVE
Candida Vaginitis: NEGATIVE
Chlamydia: NEGATIVE
Comment: NEGATIVE
Comment: NEGATIVE
Comment: NEGATIVE
Comment: NEGATIVE
Comment: NEGATIVE
Comment: NORMAL
Neisseria Gonorrhea: NEGATIVE
Trichomonas: POSITIVE — AB

## 2021-11-13 MED ORDER — METRONIDAZOLE 500 MG PO TABS: 500.0000 mg | ORAL_TABLET | Freq: Two times a day (BID) | ORAL | 0 refills | Status: DC

## 2021-11-13 NOTE — Telephone Encounter (Signed)
call could not be completed at this time, +trich on vaginal swab will rx flagyl

## 2021-11-14 ENCOUNTER — Telehealth: Payer: Self-pay | Admitting: Adult Health

## 2021-11-14 NOTE — Telephone Encounter (Signed)
Pt aware of +trich and rx sent in.No sex or alcohol during treatment.

## 2022-01-07 ENCOUNTER — Encounter (HOSPITAL_COMMUNITY): Payer: Self-pay | Admitting: *Deleted

## 2022-01-07 ENCOUNTER — Emergency Department (HOSPITAL_COMMUNITY)
Admission: EM | Admit: 2022-01-07 | Discharge: 2022-01-07 | Disposition: A | Discharge status: Home / Self Care | Payer: BLUE CROSS/BLUE SHIELD | Attending: Emergency Medicine | Admitting: Emergency Medicine

## 2022-01-07 ENCOUNTER — Emergency Department (HOSPITAL_COMMUNITY): Payer: BLUE CROSS/BLUE SHIELD

## 2022-01-07 ENCOUNTER — Other Ambulatory Visit: Payer: Self-pay

## 2022-01-07 DIAGNOSIS — M5431 Sciatica, right side: Secondary | ICD-10-CM

## 2022-01-07 DIAGNOSIS — M5441 Lumbago with sciatica, right side: Secondary | ICD-10-CM | POA: Diagnosis not present

## 2022-01-07 DIAGNOSIS — M545 Low back pain, unspecified: Secondary | ICD-10-CM | POA: Diagnosis present

## 2022-01-07 DIAGNOSIS — Z91040 Latex allergy status: Secondary | ICD-10-CM | POA: Insufficient documentation

## 2022-01-07 IMAGING — DX DG LUMBAR SPINE COMPLETE 4+V
5 series · 5 of 5 positions shown · non-contrast
Comparison: MRI lumbar spine dated [DATE]. Lumbar spine
x-rays dated [DATE].

CLINICAL DATA: Acute onset sharp lower back pain radiating to hips
after bending over last night.

EXAM:
LUMBAR SPINE - COMPLETE 4+ VIEW

[l-spine ap]
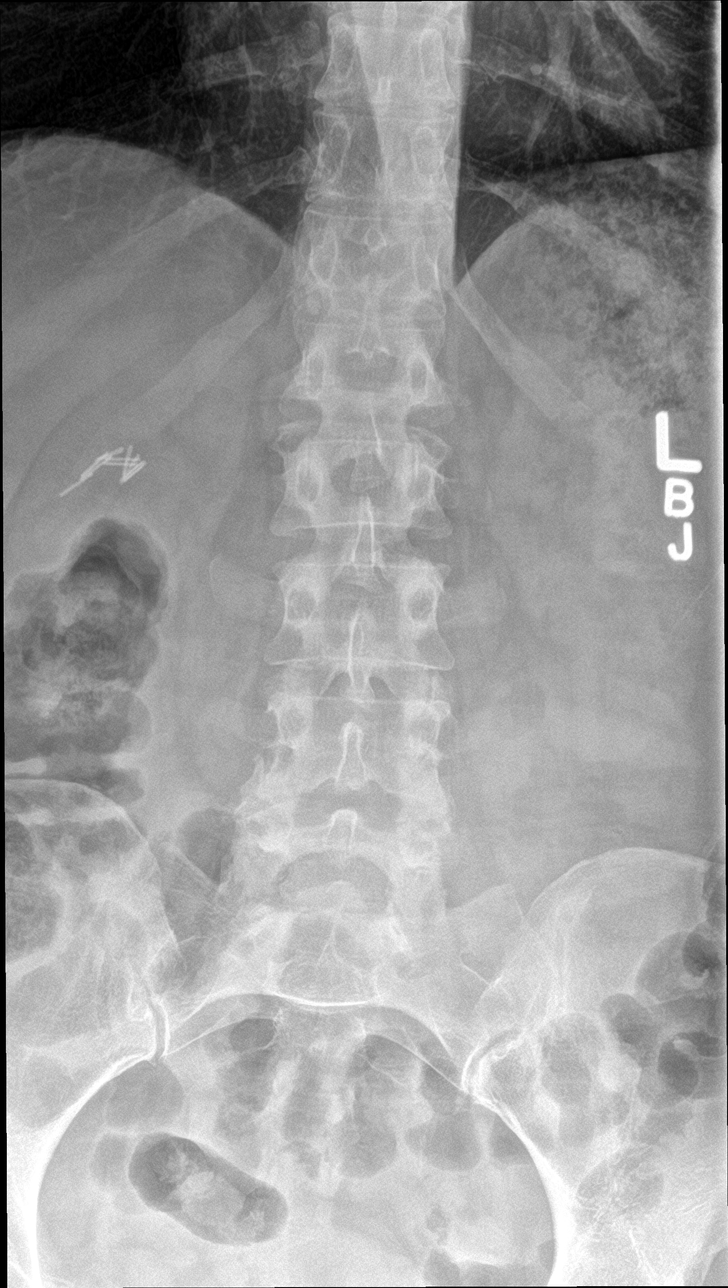

[l-spine obl (1 of 2)]
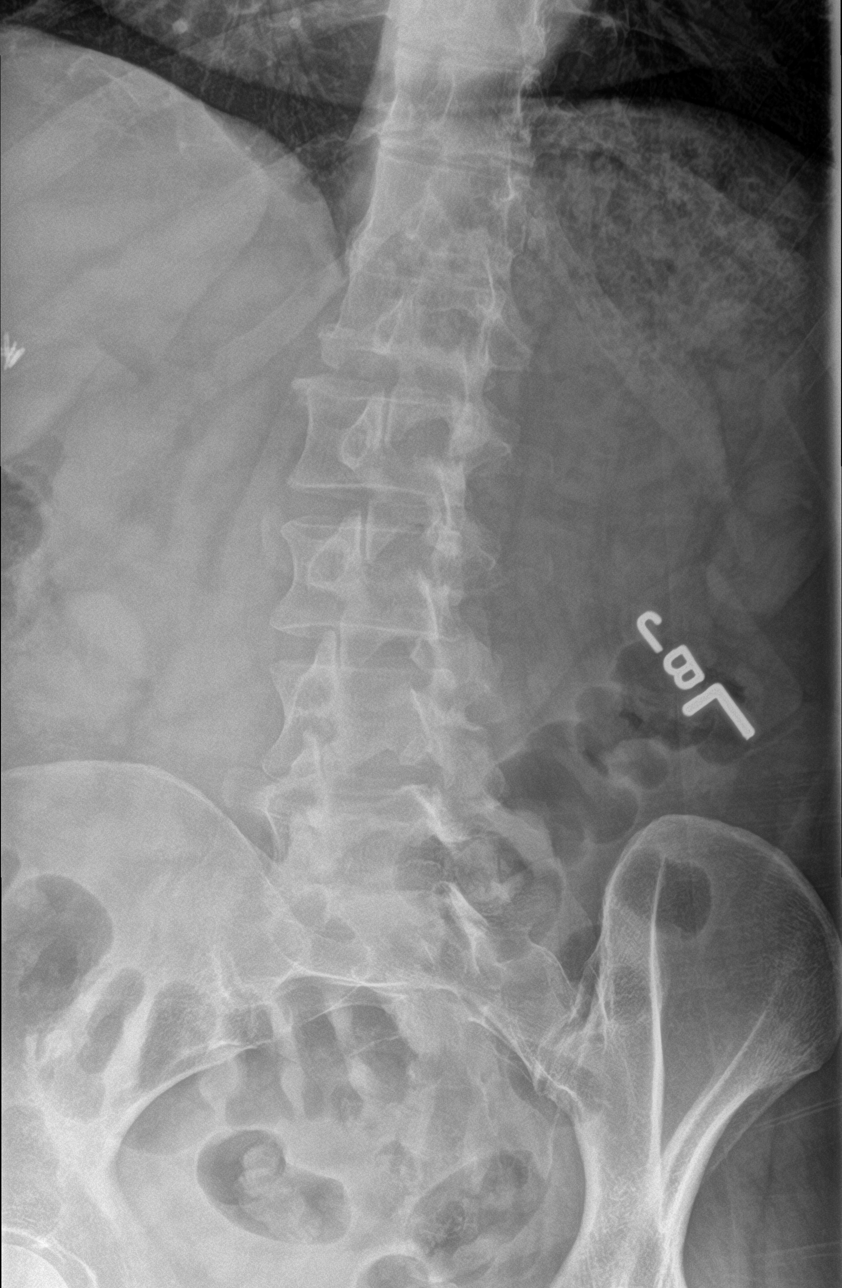

[l-spine obl (2 of 2)]
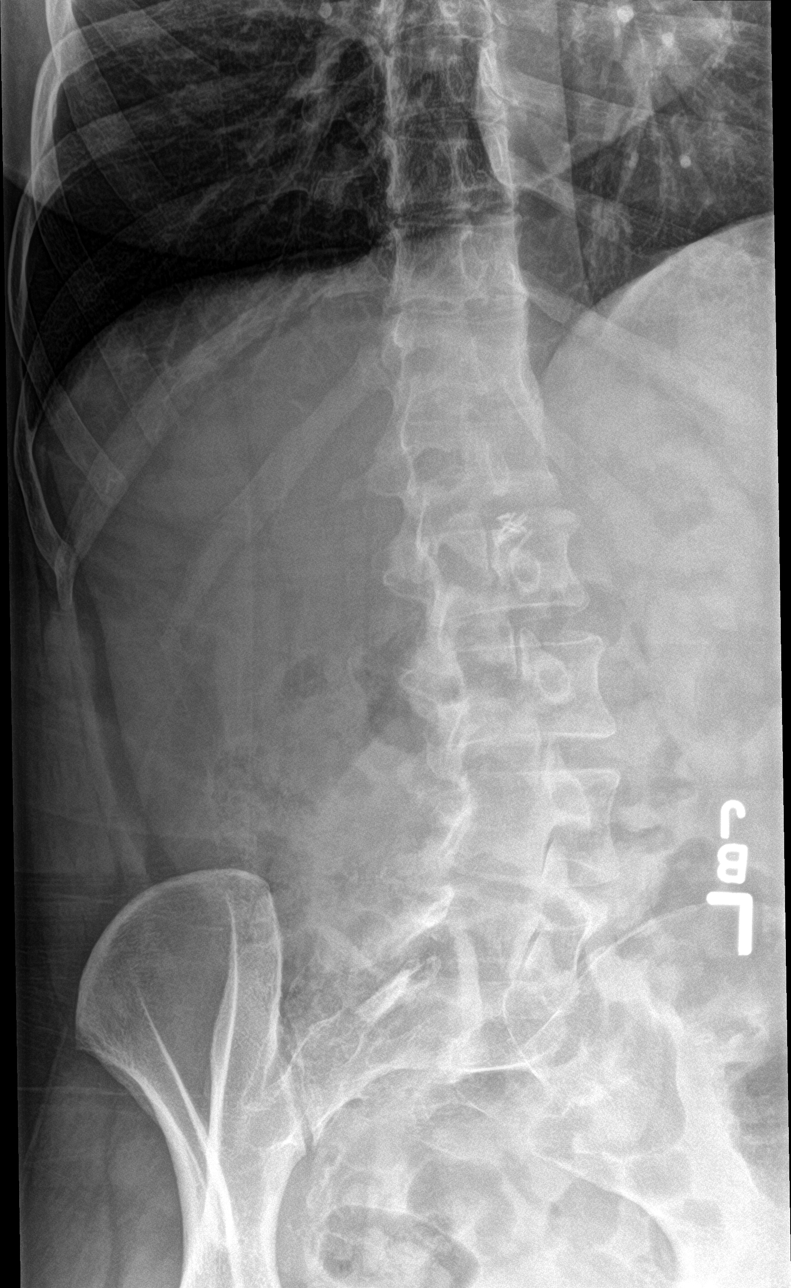

[l-spine lat]
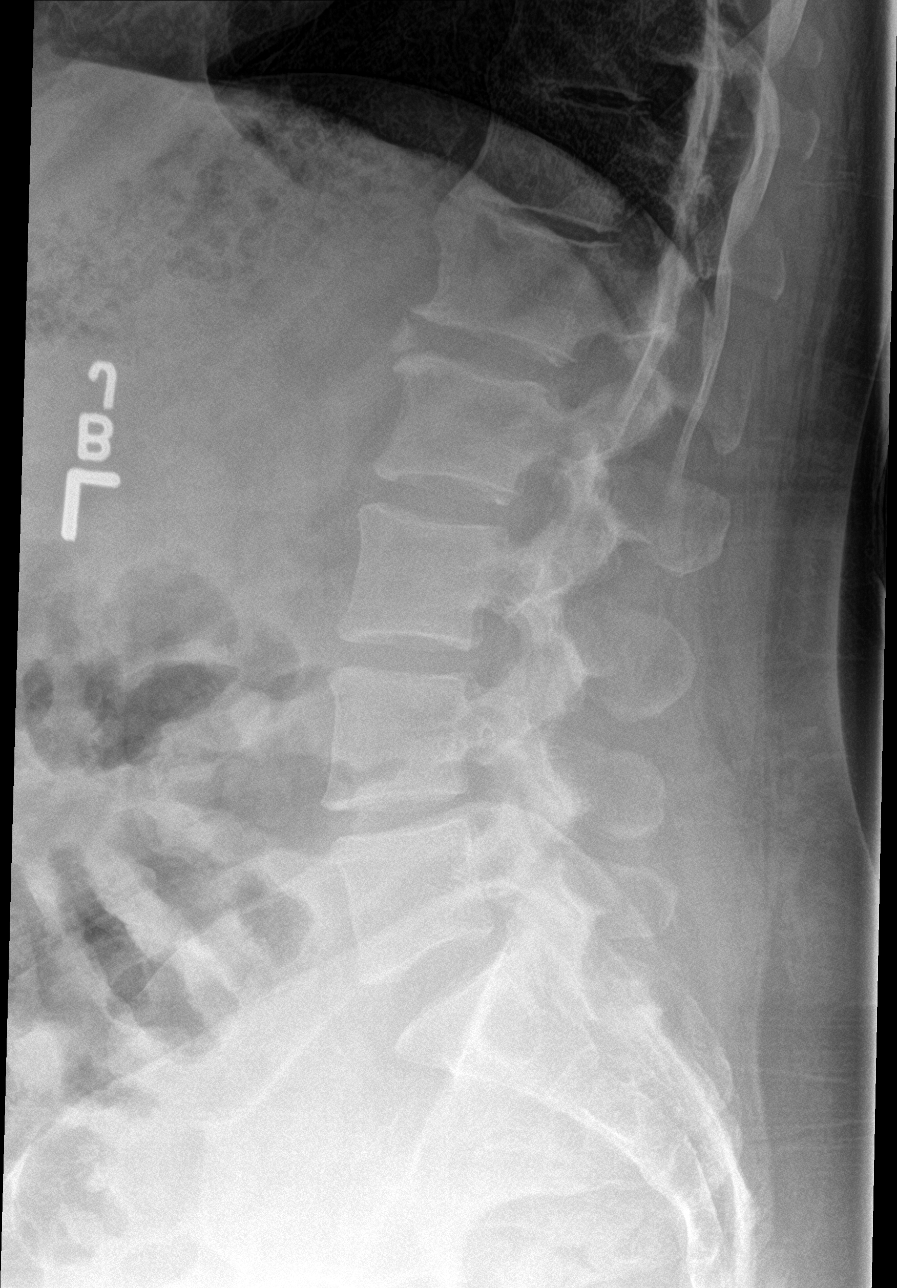

[l-spine spot]
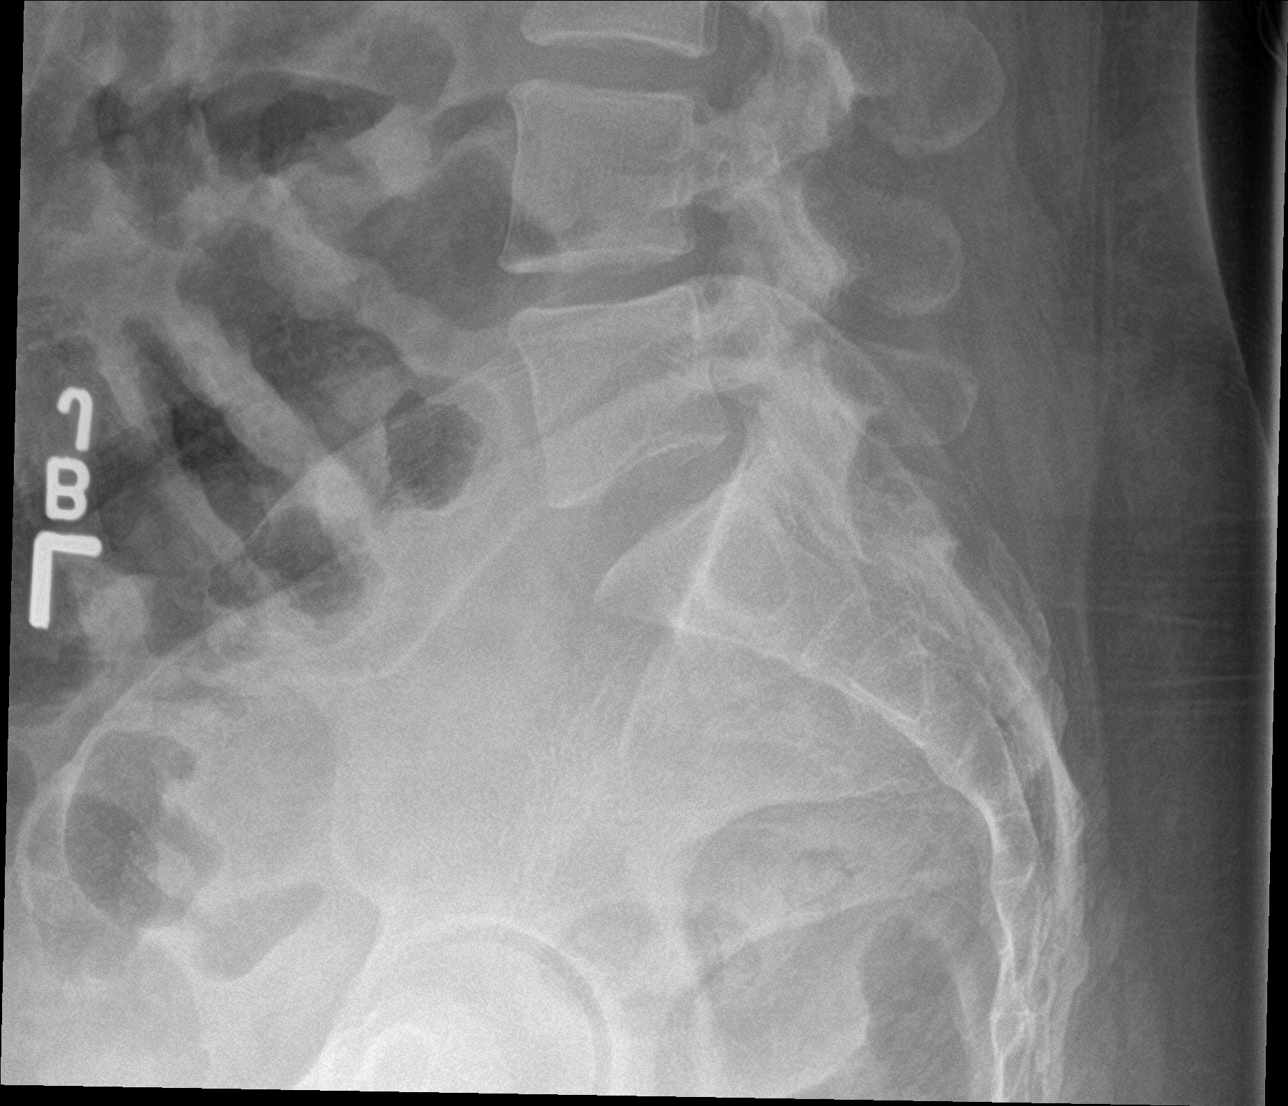

[5 of 5 positions shown; findings below may reference images not displayed]

FINDINGS: Five lumbar type vertebral bodies.

No acute fracture or subluxation. Vertebral body heights are
preserved.

Alignment is normal.

Unchanged mild degenerative disc disease T12-L1, L1-L2, and L4-L5.
Unchanged mild facet arthropathy at L4-L5 and L5-S1.

The sacroiliac joints are unremarkable.  Prior cholecystectomy.
IMPRESSION: 1. No acute osseous abnormality.
2. Unchanged mild multilevel degenerative changes.

## 2022-01-07 MED ORDER — PREDNISONE 10 MG PO TABS: 20.0000 mg | ORAL_TABLET | Freq: Every day | ORAL | 0 refills | Status: AC

## 2022-01-07 MED ORDER — HYDROMORPHONE HCL 1 MG/ML IJ SOLN
1.0000 mg | Freq: Once | INTRAMUSCULAR | Status: AC
  Administered 2022-01-07: 1 mg via INTRAMUSCULAR
  Filled 2022-01-07: qty 1

## 2022-01-07 MED ORDER — OXYCODONE-ACETAMINOPHEN 5-325 MG PO TABS: 1.0000 | ORAL_TABLET | Freq: Four times a day (QID) | ORAL | 0 refills | Status: DC | PRN

## 2022-01-07 NOTE — ED Notes (Signed)
Patient to xray.

## 2022-01-07 NOTE — ED Provider Notes (Signed)
[facility] EMERGENCY DEPARTMENT Provider Note   CSN: [id number] Arrival date & time: 01/07/22  1446     History  Chief Complaint  Patient presents with   Back Pain    Teresa [name] is a 46 y.o. female.  Patient complains of low back pain with pain radiating down her right leg.  She has had surgery before for her cysts.  This was 4 years ago  The history is provided by the patient and medical records. No language interpreter was used.  Back Pain Location:  Lumbar spine Quality:  Aching Radiates to:  R posterior upper leg Pain severity:  Moderate Onset quality:  Gradual Timing:  Constant Progression:  Worsening Chronicity:  Recurrent Context: not emotional stress   Relieved by:  Nothing Associated symptoms: no abdominal pain, no chest pain and no headaches       Home Medications Prior to Admission medications   Medication Sig Start Date End Date Taking? Authorizing Provider  oxyCODONE-acetaminophen (PERCOCET) 5-325 MG tablet Take 1 tablet by mouth every 6 (six) hours as needed. 01/07/22  Yes Milton Ferguson, MD  predniSONE (DELTASONE) 10 MG tablet Take 2 tablets (20 mg total) by mouth daily. 01/07/22  Yes Milton Ferguson, MD  hydrOXYzine (VISTARIL) 25 MG capsule Take 1 capsule (25 mg total) by mouth 3 (three) times daily as needed. 11/09/21   Estill Dooms, NP  metroNIDAZOLE (FLAGYL) 500 MG tablet Take 1 tablet (500 mg total) by mouth 2 (two) times daily. 11/13/21   Estill Dooms, NP  ondansetron (ZOFRAN ODT) 8 MG disintegrating tablet Take 1 tablet (8 mg total) by mouth every 8 (eight) hours as needed for nausea or vomiting. 11/30/20   Florian Buff, MD  PARoxetine (PAXIL) 10 MG tablet Take 1 tablet (10 mg total) by mouth daily. 11/09/21 11/09/22  Estill Dooms, NP  zolpidem (AMBIEN) 10 MG tablet Take 10 mg by mouth at bedtime as needed for sleep.    [provider]      Allergies    Latex and Vicodin [hydrocodone-acetaminophen]    Review of  Systems   Review of Systems  Constitutional:  Negative for appetite change and fatigue.  HENT:  Negative for congestion, ear discharge and sinus pressure.   Eyes:  Negative for discharge.  Respiratory:  Negative for cough.   Cardiovascular:  Negative for chest pain.  Gastrointestinal:  Negative for abdominal pain and diarrhea.  Genitourinary:  Negative for frequency and hematuria.  Musculoskeletal:  Positive for back pain.  Skin:  Negative for rash.  Neurological:  Negative for seizures and headaches.  Psychiatric/Behavioral:  Negative for hallucinations.    Physical Exam Updated Vital Signs BP 105/66 (BP Location: Right Arm)    Pulse 86    Temp 97.7 F (36.5 C) (Oral)    Resp 17    Ht 5\' 2"  (1.575 m)    Wt 77.1 kg    LMP 12/13/2021    SpO2 97%    BMI 31.09 kg/m  Physical Exam Vitals and nursing note reviewed.  Constitutional:      Appearance: She is well-developed.  HENT:     Head: Normocephalic.     Mouth/Throat:     Mouth: Mucous membranes are moist.  Eyes:     General: No scleral icterus.    Conjunctiva/sclera: Conjunctivae normal.  Neck:     Thyroid: No thyromegaly.  Cardiovascular:     Rate and Rhythm: Normal rate and regular rhythm.     Heart sounds:  No murmur heard.   No friction rub. No gallop.  Pulmonary:     Breath sounds: No stridor. No wheezing or rales.  Chest:     Chest wall: No tenderness.  Abdominal:     General: There is no distension.     Tenderness: There is no abdominal tenderness. There is no rebound.  Musculoskeletal:        General: Normal range of motion.     Cervical back: Neck supple.     Comments: Tenderness lumbar spine with positive straight leg raise on the right  Lymphadenopathy:     Cervical: No cervical adenopathy.  Skin:    Findings: No erythema or rash.  Neurological:     Mental Status: She is alert and oriented to person, place, and time.     Motor: No abnormal muscle tone.     Coordination: Coordination normal.  Psychiatric:         Behavior: Behavior normal.    ED Results / Procedures / Treatments   Labs (all labs ordered are listed, but only abnormal results are displayed) Labs Reviewed - No data to display  EKG None  Radiology DG Lumbar Spine Complete  Result Date: 01/07/2022 CLINICAL DATA:  Acute onset sharp lower back pain radiating to hips after bending over last night. EXAM: LUMBAR SPINE - COMPLETE 4+ VIEW COMPARISON:  MRI lumbar spine dated September 10, 2017. Lumbar spine x-rays dated August 15, 2017. FINDINGS: Five lumbar type vertebral bodies. No acute fracture or subluxation. Vertebral body heights are preserved. Alignment is normal. 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 Unchanged mild facet arthropathy at L4-L5 and L5-S1. The sacroiliac joints are unremarkable.  Prior cholecystectomy. IMPRESSION: 1. No acute osseous abnormality. 2. Unchanged mild multilevel degenerative changes. Electronically Signed   By: Titus Dubin M.D.   On: 01/07/2022 15:47    Procedures Procedures    Medications Ordered in ED Medications  HYDROmorphone (DILAUDID) injection 1 mg (1 mg Intramuscular Given 01/07/22 1514)    ED Course/ Medical Decision Making/ A&P                           Medical Decision Making  Patient with back pain and sciatic pain down the right leg.  X-rays unremarkable except for degenerative disease.  She will be given Percocets for pain and prednisone and will follow up with her PCP    This patient presents to the ED for concern of back pain, this involves an extensive number of treatment options, and is a complaint that carries with it a high risk of complications and morbidity.  The differential diagnosis includes sciatic pain from disc protrusion, musculoskeletal pain.  Kidney stone   Co morbidities that complicate the patient evaluation  History of back surgery   Additional history obtained:  Additional history obtained from patient and  family External records from outside source obtained and reviewed including hospital records reviewed   Lab Tests:  No labs ordered  Imaging Studies ordered:  I ordered imaging studies including lumbar spine series I independently visualized and interpreted imaging which showed degenerative changes I agree with the radiologist interpretation   Cardiac Monitoring: Not on monitor  Medicines ordered and prescription drug management:  I ordered medication including Dilaudid for pain Reevaluation of the patient after these medicines showed that the patient improved I have reviewed the patients home medicines and have made adjustments as needed   Test Considered:  MRI lumbar spine  Critical Interventions:  IM pain medicine   Consultations Obtained:  No consultation Problem List / ED Course:  Back pain with sciatica pain right leg   Reevaluation:  After the interventions noted above, I reevaluated the patient and found that they have :improved   Social Determinants of Health:  Nothing   Dispostion:  After consideration of the diagnostic results and the patients response to treatment, I feel that the patent would benefit from patient discharged home with Percocets and prednisone will follow up with PCP.          Final Clinical Impression(s) / ED Diagnoses Final diagnoses:  Sciatica of right side    Rx / DC Orders ED Discharge Orders          Ordered    oxyCODONE-acetaminophen (PERCOCET) 5-325 MG tablet  Every 6 hours PRN        01/07/22 1558    predniSONE (DELTASONE) 10 MG tablet  Daily        01/07/22 1558              Milton Ferguson, MD 01/07/22 1606

## 2022-01-07 NOTE — Discharge Instructions (Signed)
Follow-up with your family doctor next week for recheck. 

## 2022-01-07 NOTE — ED Triage Notes (Signed)
Pt bent over to pick something up and began to have on right lower back pain that radiates down right leg.  Pt with hx of decompressed nerve L3 to back with surgery about 4 years ago.  Pt states pain feels the same.

## 2022-01-09 ENCOUNTER — Telehealth: Payer: Self-pay | Admitting: Orthopaedic Surgery

## 2022-01-09 NOTE — Telephone Encounter (Signed)
Patient called to inquire about an immediate appointment for her back, following emergency room visit at [facility] on 01/07/22. Discussed appointment, however, patient history of back surgery; said she does not want to return to the provider who did her back surgery.  Patient states she "has fluid on her spine"; states if we don't have a back specialist, she may go ahead and call her primary care provider to advise of a back specialist. Aware to call back if she needs to see one of our providers.

## 2022-01-10 ENCOUNTER — Ambulatory Visit: Payer: BLUE CROSS/BLUE SHIELD | Admitting: Orthopaedic Surgery

## 2022-01-10 ENCOUNTER — Other Ambulatory Visit: Payer: Self-pay

## 2022-01-10 ENCOUNTER — Encounter: Payer: Self-pay | Admitting: Orthopaedic Surgery

## 2022-01-10 DIAGNOSIS — M5441 Lumbago with sciatica, right side: Secondary | ICD-10-CM | POA: Diagnosis not present

## 2022-01-10 DIAGNOSIS — M545 Low back pain, unspecified: Secondary | ICD-10-CM | POA: Insufficient documentation

## 2022-01-10 DIAGNOSIS — G8929 Other chronic pain: Secondary | ICD-10-CM | POA: Diagnosis not present

## 2022-01-10 MED ORDER — OXYCODONE-ACETAMINOPHEN 5-325 MG PO TABS: 1.0000 | ORAL_TABLET | Freq: Four times a day (QID) | ORAL | 0 refills | Status: DC | PRN

## 2022-01-10 NOTE — Progress Notes (Signed)
Office Visit Note   Patient: Teresa [name]           Date of Birth: Jan 19, 1976           MRN: [id number] Visit Date: 01/10/2022              Requested by: [place], [place] [address] [place],  [place] [address] PCP: [facility]   Assessment & Plan: Visit Diagnoses:  1. Chronic right-sided low back pain with right-sided sciatica     Plan: Mrs. Treptow is accompanied by her husband and here for evaluation of acute on chronic low back pain.  Her history is significant and that she sustained an on-the-job injury in 2019 and eventually had surgery by Dr. Lynann Bologna in [place].  She describes the decompression of the "L3".  She has developed chronic pain as a result of the surgery and is on gabapentin 300 mg 3 times a day.  She has been able to to work but relates that she had an exacerbation of her chronic pain about 4 days ago.  The pain originates in her back radiates to her right buttock right groin and then into her right leg.  She has not had any bowel or bladder changes.  Denies numbness or tingling.  This is the same distribution of pain that she has had in the past but "more intense".  She was seen in the [facility] emergency room 3 days ago.  X-rays were obtained without any acute changes.  She was placed on prednisone and oxycodone.  She notes that she has been unable to work because she is so uncomfortable.  Neurologically appeared to be intact.  I did review the x-rays without significant change other than there was some decrease in the disc space height at L4 and L5 but no listhesis.  Her pain distribution is the same but has been on a chronic basis but more intense.  At this point without neurologic deficit I am going to keep her out of work and have her continue on the prednisone and the oxycodone.  Like to check her back in a week if no improvement then would suggest a repeat MRI scan .  This recent exacerbation was not an on-the-job  injury  Follow-Up Instructions: Return in about 1 week (around 01/17/2022).   Orders:  No orders of the defined types were placed in this encounter.  Meds ordered this encounter  Medications   oxyCODONE-acetaminophen (PERCOCET) 5-325 MG tablet    Sig: Take 1 tablet by mouth every 6 (six) hours as needed.    Dispense:  20 tablet    Refill:  0      Procedures: No procedures performed   Clinical Data: No additional findings.   Subjective: Chief Complaint  Patient presents with   Lower Back - Pain  Patient presents today with lower back pain. She said that it started hurting this past weekend. She has pain running down her right leg. She went to the ED at [facility] and had x-rays taken. She was given oral prednisone. She states that she has a history of L-Spine surgery with a physician at [place] in 2018. 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  Present pain is in the same distribution and has been on a chronic basis but more intense.  There is no pain referable to the left lower extremity.  The pain begins in her  low back radiates to her right buttock right groin right thigh into her right leg.  There is no numbness or tingling.  She has had some constipation since starting the oxycodone.  She has been on gabapentin 300 mg 3 times a day per her primary care physician.  She has had chronic pain in her lumbar spine and into her right leg since her surgery in 2019.  Surgeon was Dr. Lynann Bologna.  This was an on-the-job injury.  She denies any recent injury or trauma.  She was off of work for approximately 2 years after the injury and then return to her present job working in a [employer] a lot of repetitive work even some of which may be heavy lifting  HPI  Review of Systems   Objective: Vital Signs: Ht 5\' 2"  (1.575 m)    Wt 170 lb (77.1 kg)    LMP 12/13/2021    BMI 31.09 kg/m   Physical Exam Constitutional:      Appearance: She  is well-developed.  Eyes:     Pupils: Pupils are equal, round, and reactive to light.  Pulmonary:     Effort: Pulmonary effort is normal.  Skin:    General: Skin is warm and dry.  Neurological:     Mental Status: She is alert and oriented to person, place, and time.  Psychiatric:        Behavior: Behavior normal.    Ortho Exam awake alert and oriented x3.  Comfortable sitting straight leg raise was negative on the left but was positive on the right for back pain and right groin discomfort.  No pain with range of motion of either hip.  Reflexes appear to be symmetrical.  Motor and sensory exam appeared to be intact.  There were areas of tenderness at the lumbosacral junction.  There are also areas of discomfort in the right parasacral region.  No pain about the left or right hip.  Patient walked without a limp but had difficulty sitting as it exacerbated her back pain.  Healed surgical incision along the lumbar spine  Specialty Comments:  No specialty comments available.  Imaging: No results found.   PMFS History: Patient Active Problem List   Diagnosis Date Noted   Low back pain 01/10/2022   Trichimoniasis 11/13/2021   Screening examination for STD (sexually transmitted disease) 11/09/2021   Vaginal discharge 11/09/2021   Encounter for well woman exam with routine gynecological exam 11/09/2021   Perimenopause 11/09/2021   Endometrial polyp 10/25/2020   Encounter for screening fecal occult blood testing 10/12/2020   Encounter for gynecological examination with Papanicolaou smear of cervix 10/12/2020   Depression 10/12/2020   Hot flashes 10/12/2020   Bloating 10/12/2020   Menorrhagia with regular cycle 10/12/2020   Anxiety 10/12/2020   Past Medical History:  Diagnosis Date   Anxiety    Arthritis    knees    Asthma due to seasonal allergies    Bipolar disorder ([place])    Cancer ([place])    hx of cervical cancer- laser surgery done    Carpal tunnel syndrome    bilateral     GERD (gastroesophageal reflux disease)    OCD (obsessive compulsive disorder)    2013   PTSD (post-traumatic stress disorder)    2013   Shortness of breath dyspnea    allergy related    Trichimoniasis 11/13/2021   Treated 11/13/21 with flagyl   Trigger finger    bilateral middle fingers     Family  History  Problem Relation Age of Onset   Hypertension Maternal Grandfather    COPD Mother     Past Surgical History:  Procedure Laterality Date   CARDIAC CATHETERIZATION     CHOLECYSTECTOMY N/A 05/29/2016   Procedure: LAPAROSCOPIC CHOLECYSTECTOMY WITH INTRAOPERATIVE CHOLANGIOGRAM;  Surgeon: Jackolyn Confer, MD;  Location: [facility];  Service: General;  Laterality: N/A;   DILATATION AND CURETTAGE/HYSTEROSCOPY WITH MINERVA N/A 11/30/2020   Procedure: DILATATION AND CURETTAGE/HYSTEROSCOPY WITH MINERVA Endometrial Ablation;  Surgeon: Florian Buff, MD;  Location: [facility];  Service: Gynecology;  Laterality: N/A;   laser on cervix     LUMBAR LAMINECTOMY Right 08/15/2017   Procedure: RIGHT SIDED LUMBAR FOUR-FIVE  MICRODISCECTOMY.;  Surgeon: Phylliss Bob, MD;  Location: [facility];  Service: Orthopedics;  Laterality: Right;   MOUTH SURGERY     tooth removed   TUBAL LIGATION     WISDOM TOOTH EXTRACTION     Social History   Occupational History   Not on file  Tobacco Use   Smoking status: Every Day    Packs/day: 1.00    Years: 24.00    Pack years: 24.00    Types: Cigarettes   Smokeless tobacco: Never  Vaping Use   Vaping Use: Never used  Substance and Sexual Activity   Alcohol use: Yes   Drug use: Yes    Types: Marijuana   Sexual activity: Yes    Birth control/protection: Surgical    Comment: tubal

## 2022-01-17 ENCOUNTER — Encounter: Payer: Self-pay | Admitting: Orthopaedic Surgery

## 2022-01-17 ENCOUNTER — Ambulatory Visit (INDEPENDENT_AMBULATORY_CARE_PROVIDER_SITE_OTHER): Payer: BLUE CROSS/BLUE SHIELD | Admitting: Orthopaedic Surgery

## 2022-01-17 ENCOUNTER — Other Ambulatory Visit: Payer: Self-pay

## 2022-01-17 VITALS — Ht 62.0 in | Wt 172.0 lb

## 2022-01-17 DIAGNOSIS — W010XXA Fall on same level from slipping, tripping and stumbling without subsequent striking against object, initial encounter: Secondary | ICD-10-CM

## 2022-01-17 DIAGNOSIS — M25562 Pain in left knee: Secondary | ICD-10-CM

## 2022-01-17 MED ORDER — NAPROXEN 500 MG PO TABS: 500.0000 mg | ORAL_TABLET | Freq: Two times a day (BID) | ORAL | 5 refills | Status: AC

## 2022-01-17 MED ORDER — OXYCODONE-ACETAMINOPHEN 5-325 MG PO TABS: ORAL_TABLET | ORAL | 0 refills | Status: DC

## 2022-01-17 NOTE — Addendum Note (Signed)
Addended by: Derek Mound A on: 01/17/2022 10:57 AM   Modules accepted: Orders

## 2022-01-17 NOTE — Progress Notes (Signed)
I fell and hurt my left knee.  She is a patient of Dr. Rudene Anda who saw her last week for lower back pain.    She slipped on acorns Saturday January 13, 2022 and fell and hurt her left knee.  She has medial pain, swelling, popping, difficulty in getting full extension.  She has been on the pain medicine from Dr. Durward Fortes, used ice, heat with some help.  She as an Ace bandage.  She limps.  She is not getting better.  The knee hurts all the time.  Left knee has effusion, medial pain, ROM 5 to 90 with pain, positive laxity 1+ medially of collateral ligament, crepitus, weakly positive drawer sign, positive medial [place], limp, NV intact, no distal edema.  X-rays were done of the left knee, reported separately.  She had effusion of the left knee, No fracture or loose body noted.  Bone quality is good.    Encounter Diagnosis  Name Primary?   Acute pain of left knee Yes   I am concerned about medial collateral ligament partial tear, medial meniscus tear and perhaps ACL problem.  She cannot fully extend the knee.  She has not improved.  She may need surgery.  I would like to get a MRI of the knee asap.  I will renew her pain medicine.  I will give Naprosyn 500 po bid pc  I will give note to be out of work.  Return in two weeks.  I will give brace for knee.  I have reviewed the [place] web site prior to prescribing narcotic medicine for this patient.  Call if any problem.  Precautions discussed.  Electronically Signed Sanjuana Kava, MD 1/18/202310:47 AM

## 2022-01-18 ENCOUNTER — Encounter: Payer: Self-pay | Admitting: Orthopaedic Surgery

## 2022-01-22 ENCOUNTER — Telehealth: Payer: Self-pay | Admitting: Radiology

## 2022-01-22 NOTE — Telephone Encounter (Signed)
Oxycodone denied by insurance.  FYI.

## 2022-01-23 ENCOUNTER — Telehealth: Payer: Self-pay | Admitting: Orthopaedic Surgery

## 2022-01-23 NOTE — Telephone Encounter (Signed)
Call back from patient regarding pain medication request [id number] (PERCOCET/ROXICET) 5-325 MG tablet   which previous note per Teresa [name] indicates denied. States she has spoken with [place], [place], yesterday, 01/22/22, and that prior authorization is needed - said they have faxed it to our office. Patient said she will run out of medication today. Please advise.

## 2022-01-23 NOTE — Telephone Encounter (Signed)
Patient's insurer [employer] prescription plan representative, Lisette Grinder [phone], relaying that the prior auth for this prescription was denied, as more details are needed, including diagnosis. Please advise.

## 2022-01-24 ENCOUNTER — Ambulatory Visit (HOSPITAL_COMMUNITY)
Admission: RE | Admit: 2022-01-24 | Discharge: 2022-01-24 | Disposition: A | Discharge status: Home / Self Care | Payer: BLUE CROSS/BLUE SHIELD | Source: Ambulatory Visit | Attending: Orthopaedic Surgery | Admitting: Orthopaedic Surgery

## 2022-01-24 ENCOUNTER — Other Ambulatory Visit: Payer: Self-pay

## 2022-01-24 DIAGNOSIS — M25562 Pain in left knee: Secondary | ICD-10-CM | POA: Diagnosis present

## 2022-01-24 IMAGING — MR MR KNEE*L* W/O CM
7 series · 40 of 40 positions shown · non-contrast
Comparison: None.

CLINICAL DATA: Left knee pain related to a fall injury 2 weeks ago.
Clinical concern for meniscal tear.

EXAM:
MRI OF THE LEFT KNEE WITHOUT CONTRAST
TECHNIQUE: Multiplanar, multisequence MR imaging of the knee was performed. No
intravenous contrast was administered.

[Series 8: T2 fat-sat · axial · left · 4.0mm · 0.47mm/px · z∈[-78,+47]mm · 6 of 26 slices shown (1 of 3)]
[im 1/26]
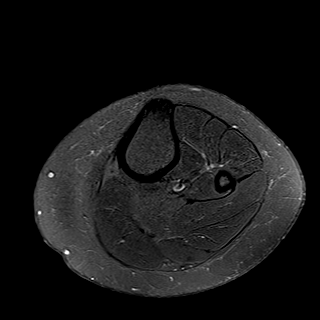
[im 6/26]
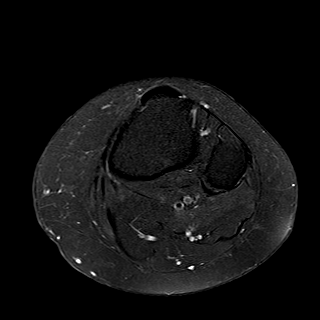
[im 11/26]
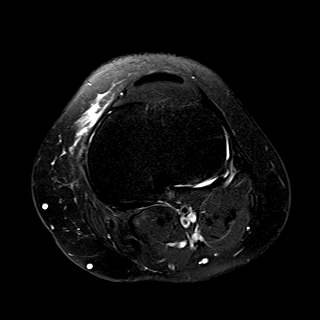
[im 16/26]
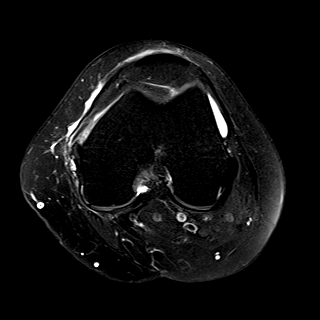
[im 21/26]
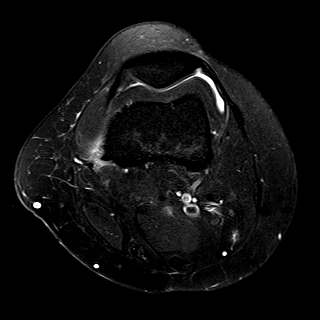
[im 26/26]
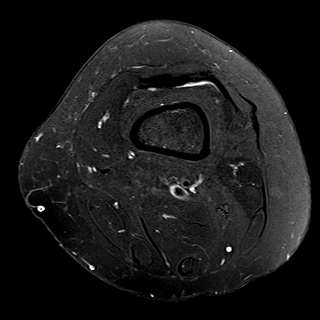

[Series 9: T1 · coronal · left · 4.0mm · 0.59mm/px · 5 of 24 slices shown]
[im 1/24]
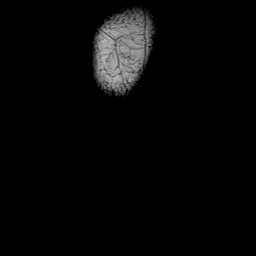
[im 6/24]
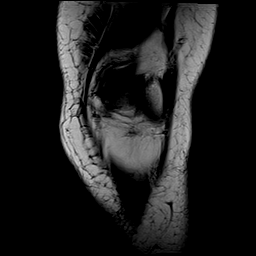
[im 12/24]
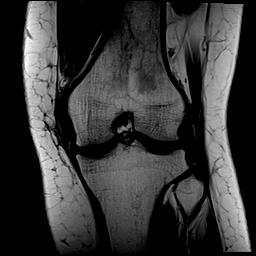
[im 18/24]
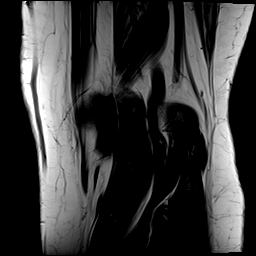
[im 24/24]
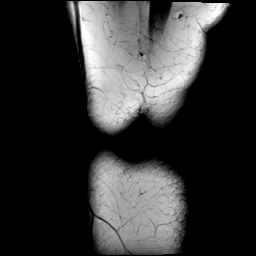

[Series 10: T2 fat-sat · coronal · left · 4.0mm · 0.59mm/px · 5 of 24 slices shown (2 of 3)]
[im 1/24]
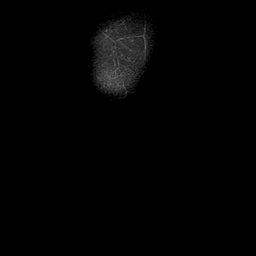
[im 6/24]
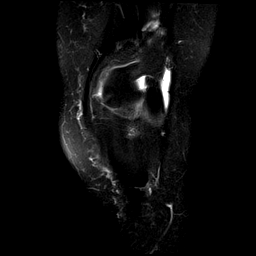
[im 12/24]
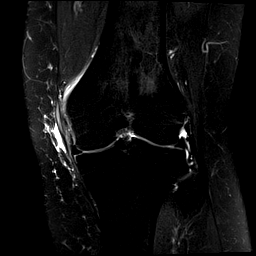
[im 18/24]
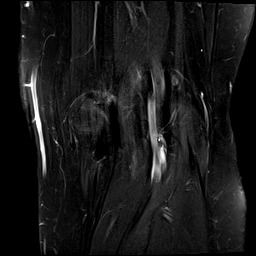
[im 24/24]
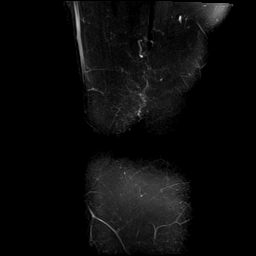

[Series 11: PD fat-sat · coronal · left · 3.0mm · 0.59mm/px · 8 of 39 slices shown (1 of 2)]
[im 1/39]
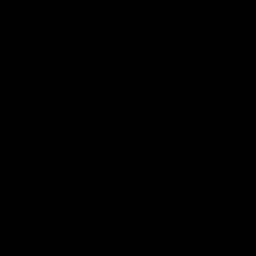
[im 6/39]
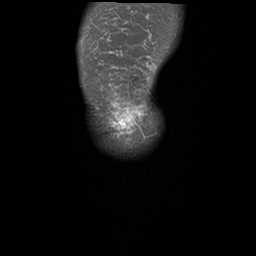
[im 11/39]
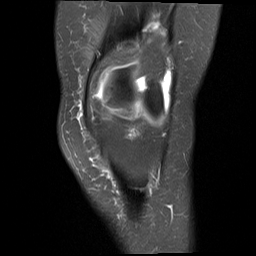
[im 17/39]
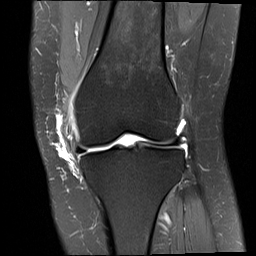
[im 22/39]
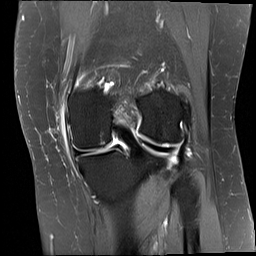
[im 28/39]
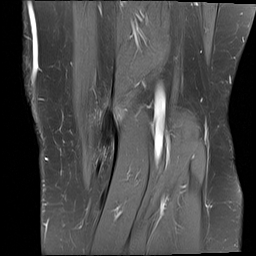
[im 33/39]
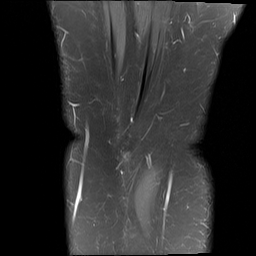
[im 39/39]
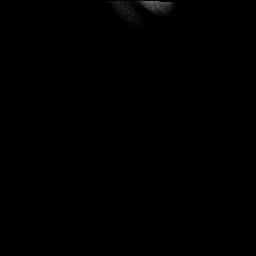

[Series 12: PD fat-sat · sagittal · left · 3.0mm · 0.59mm/px · 6 of 27 slices shown (2 of 2)]
[im 1/27]
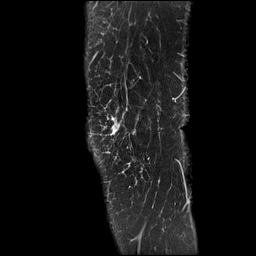
[im 6/27]
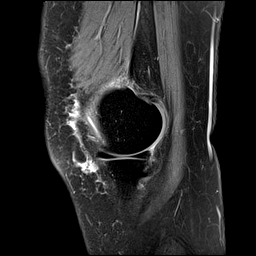
[im 11/27]
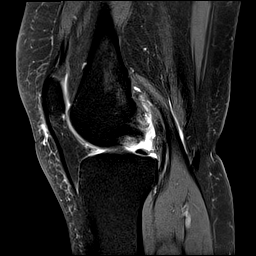
[im 16/27]
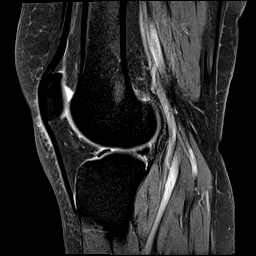
[im 21/27]
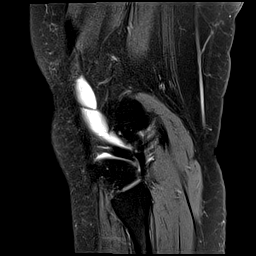
[im 27/27]
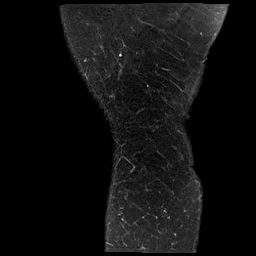

[Series 13: T2 fat-sat · sagittal · left · 3.0mm · 0.59mm/px · 6 of 28 slices shown (3 of 3)]
[im 1/28]
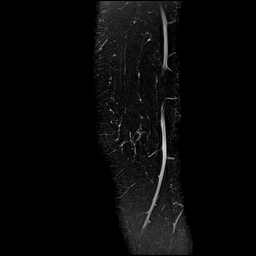
[im 6/28]
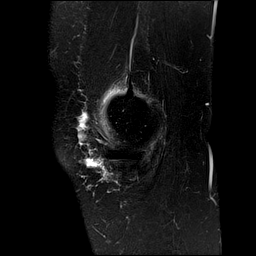
[im 11/28]
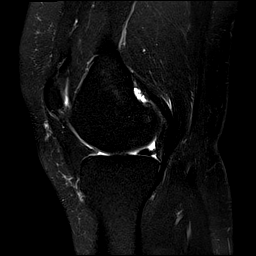
[im 17/28]
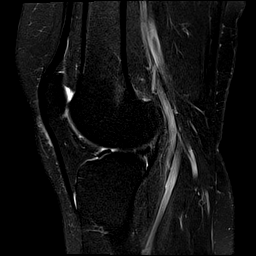
[im 22/28]
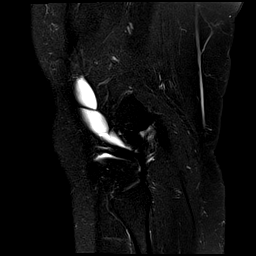
[im 28/28]
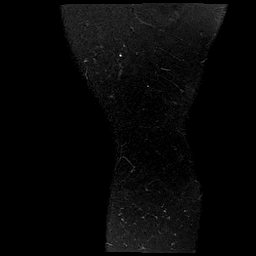

[Series 14: PD · coronal · left · 2.0mm · 0.47mm/px · 4 of 20 slices shown]
[im 1/20]
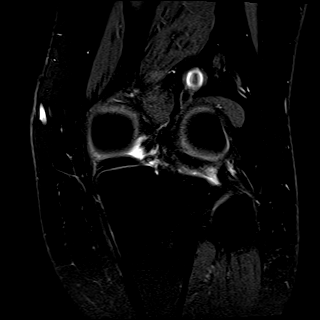
[im 7/20]
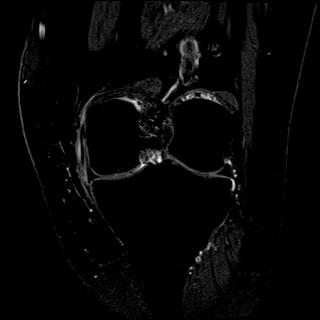
[im 13/20]
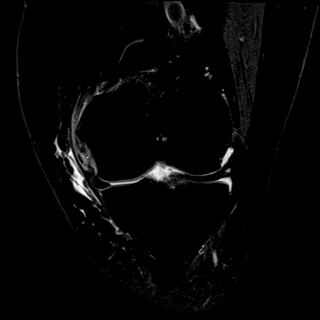
[im 20/20]
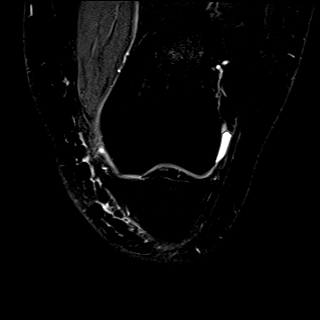

[40 of 40 positions shown; findings below may reference images not displayed]

FINDINGS: MENISCI

Medial meniscus:  Intact.

Lateral meniscus:  Intact.

LIGAMENTS

Cruciates: Nonvisualization of the ACL.  Intact PCL.

Collaterals: Thickening and partial-thickness tearing of the
proximal MCL with associated periligamentous edema/fluid. Lateral
collateral ligament complex intact.

CARTILAGE

Patellofemoral:  No chondral defect.

Medial: Mild chondral thinning and surface irregularity of the
weight-bearing medial compartment.

Lateral: 4 x 4 mm full-thickness cartilage defect of the lateral
femoral condyle centrally.

MISCELLANEOUS

Joint:  Small joint effusion. Fat pads within normal limits.

Popliteal Fossa:  No Baker's cyst. Intact popliteus tendon.

Extensor Mechanism:  Intact quadriceps and patellar tendons.

Bones: No acute fracture. No dislocation. No bone contusions or
sites of focal bone marrow edema. No marrow replacing bone lesion.

Other: No significant periarticular soft tissue findings.
IMPRESSION: 1. Grade 2 MCL sprain.
2. Nonvisualization of the ACL, likely related to chronic tear. No
acute bone contusions or other findings to suggest recent ACL
injury.
3. Medial and lateral compartment osteoarthritis, as described.
4. Small joint effusion.
5. Intact menisci.

## 2022-01-25 ENCOUNTER — Telehealth: Payer: Self-pay

## 2022-01-25 MED ORDER — OXYCODONE-ACETAMINOPHEN 5-325 MG PO TABS: ORAL_TABLET | ORAL | 0 refills | Status: AC

## 2022-01-25 NOTE — Telephone Encounter (Signed)
Oxycodone-Acetaminophen 5/325 mg  7 day supply  PATIENT USES [employer] [facility]

## 2022-01-30 NOTE — Telephone Encounter (Signed)
Patient called back in regard to the prior auth for the insurance; asking for Inez Catalina to call her back - 'has questions about what was told to the insurance"; please call her back.

## 2022-01-31 ENCOUNTER — Other Ambulatory Visit: Payer: Self-pay

## 2022-01-31 ENCOUNTER — Encounter: Payer: Self-pay | Admitting: Orthopaedic Surgery

## 2022-01-31 ENCOUNTER — Ambulatory Visit: Payer: BLUE CROSS/BLUE SHIELD | Admitting: Orthopaedic Surgery

## 2022-01-31 VITALS — Ht 62.0 in | Wt 172.0 lb

## 2022-01-31 DIAGNOSIS — F17210 Nicotine dependence, cigarettes, uncomplicated: Secondary | ICD-10-CM | POA: Diagnosis not present

## 2022-01-31 DIAGNOSIS — M25562 Pain in left knee: Secondary | ICD-10-CM

## 2022-01-31 NOTE — Progress Notes (Signed)
My knee is a little better.  She has had less pain in the left knee.  She is using her brace.  Insurance denied her pain medicine.  MRI of the left knee showed: IMPRESSION: 1. Grade 2 MCL sprain. 2. 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 No acute bone contusions or other findings to suggest recent ACL injury. 3. Medial and lateral compartment osteoarthritis, as described. 4. Small joint effusion. 5. Intact menisci.  I have independently reviewed the MRI.      I have explained the findings to her.  She seems to understand.  No surgery is needed.  ROM of the left knee is 0 to 115, stable, slight crepitus and slight effusion, limp to left, has knee brace.  NV intact.  Encounter Diagnoses  Name Primary?   Acute pain of left knee Yes   Nicotine dependence, cigarettes, uncomplicated    Continue her Naprosyn.  Continue brace.  Return to work.  Return here in two weeks.  Call if any problem.  Precautions discussed.  Electronically Signed Sanjuana Kava, MD 2/1/202311:04 AM

## 2022-01-31 NOTE — Patient Instructions (Signed)

## 2022-01-31 NOTE — Telephone Encounter (Signed)
Noted  

## 2022-02-01 ENCOUNTER — Other Ambulatory Visit (HOSPITAL_COMMUNITY): Payer: Self-pay | Admitting: Physician Assistant

## 2022-02-01 DIAGNOSIS — N6011 Diffuse cystic mastopathy of right breast: Secondary | ICD-10-CM

## 2022-02-14 ENCOUNTER — Ambulatory Visit: Payer: BLUE CROSS/BLUE SHIELD | Admitting: Orthopaedic Surgery

## 2022-02-18 ENCOUNTER — Other Ambulatory Visit: Payer: Self-pay | Admitting: Adult Health

## 2022-02-19 ENCOUNTER — Telehealth: Payer: Self-pay | Admitting: Adult Health

## 2022-02-19 MED ORDER — VALACYCLOVIR HCL 1 G PO TABS: 1000.0000 mg | ORAL_TABLET | Freq: Two times a day (BID) | ORAL | 3 refills | Status: AC

## 2022-02-19 NOTE — Addendum Note (Signed)
Addended by: Derrek Monaco A on: 02/19/2022 01:48 PM   Modules accepted: Orders

## 2022-02-19 NOTE — Telephone Encounter (Addendum)
Pt is having a herpes outbreak. Started Saturday. Can you send in med? Thanks!! [place]

## 2022-02-19 NOTE — Telephone Encounter (Signed)
Will rx valtrex

## 2022-02-19 NOTE — Telephone Encounter (Signed)
Patient calling stating that she is having a herps out break [place] area And if can [employer]

## 2022-03-06 ENCOUNTER — Other Ambulatory Visit: Payer: Self-pay

## 2022-03-06 ENCOUNTER — Ambulatory Visit (HOSPITAL_COMMUNITY)
Admission: RE | Admit: 2022-03-06 | Discharge: 2022-03-06 | Disposition: A | Discharge status: Home / Self Care | Payer: BLUE CROSS/BLUE SHIELD | Source: Ambulatory Visit | Attending: Physician Assistant | Admitting: Physician Assistant

## 2022-03-06 DIAGNOSIS — N6011 Diffuse cystic mastopathy of right breast: Secondary | ICD-10-CM

## 2022-03-06 IMAGING — MG MM DIGITAL DIAGNOSTIC UNILAT*R* W/ TOMO W/ CAD
4 series · 4 of 12 positions shown · non-contrast
Comparison: Previous exams.

CLINICAL DATA: Short-term follow-up for probably benign right
breast mass.

EXAM:
DIGITAL DIAGNOSTIC UNILATERAL RIGHT MAMMOGRAM WITH TOMOSYNTHESIS AND
CAD; ULTRASOUND RIGHT BREAST LIMITED
TECHNIQUE: Right digital diagnostic mammography and breast tomosynthesis was
performed. The images were evaluated with computer-aided detection.;
Targeted ultrasound examination of the right breast was performed

[R CC synth-2D]
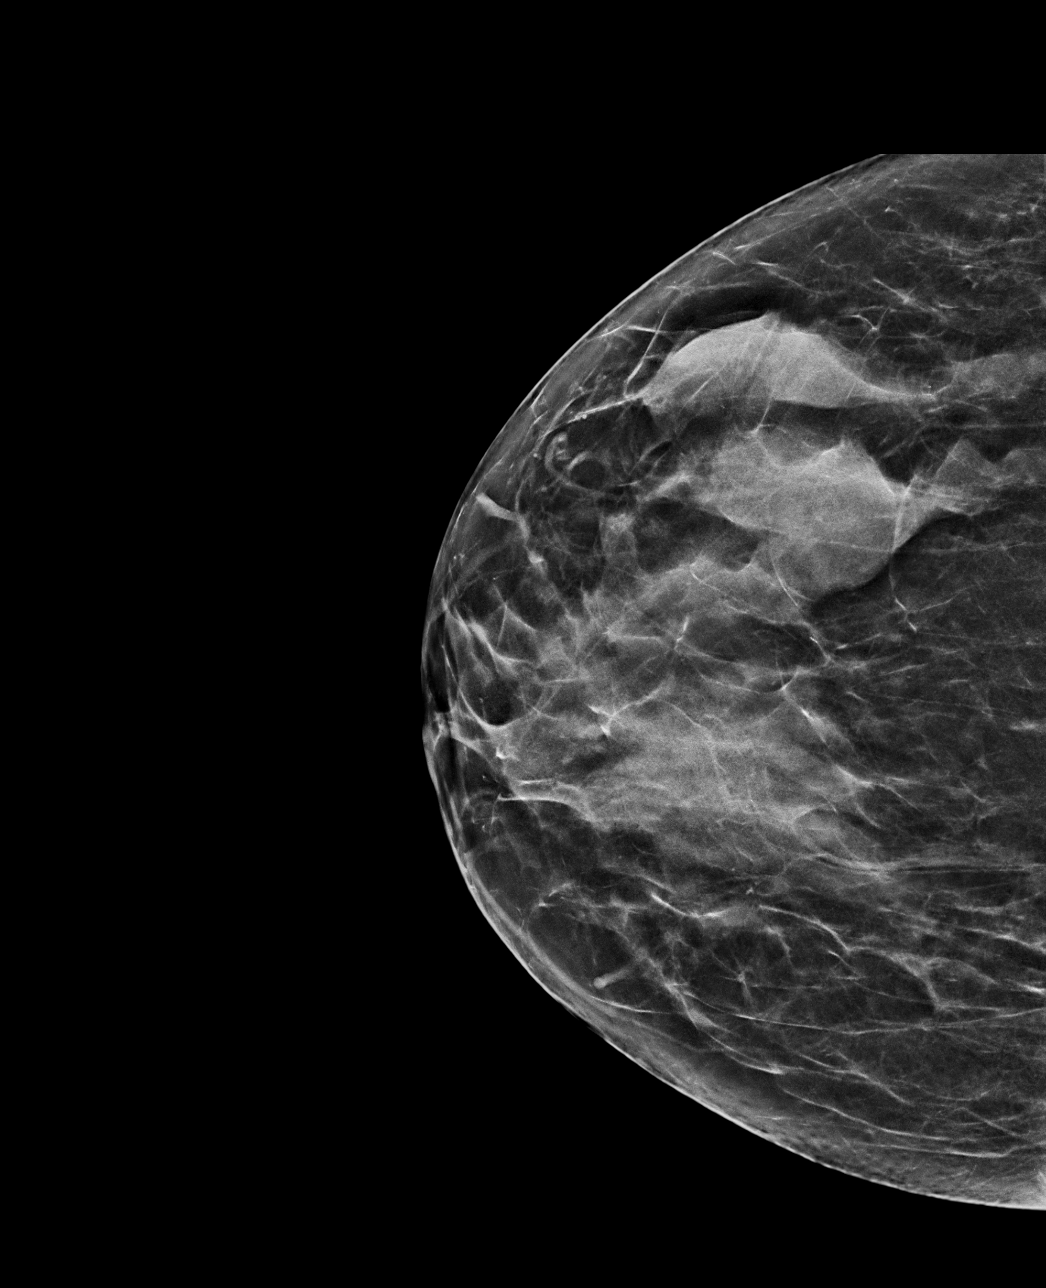

[R MLO synth-2D]
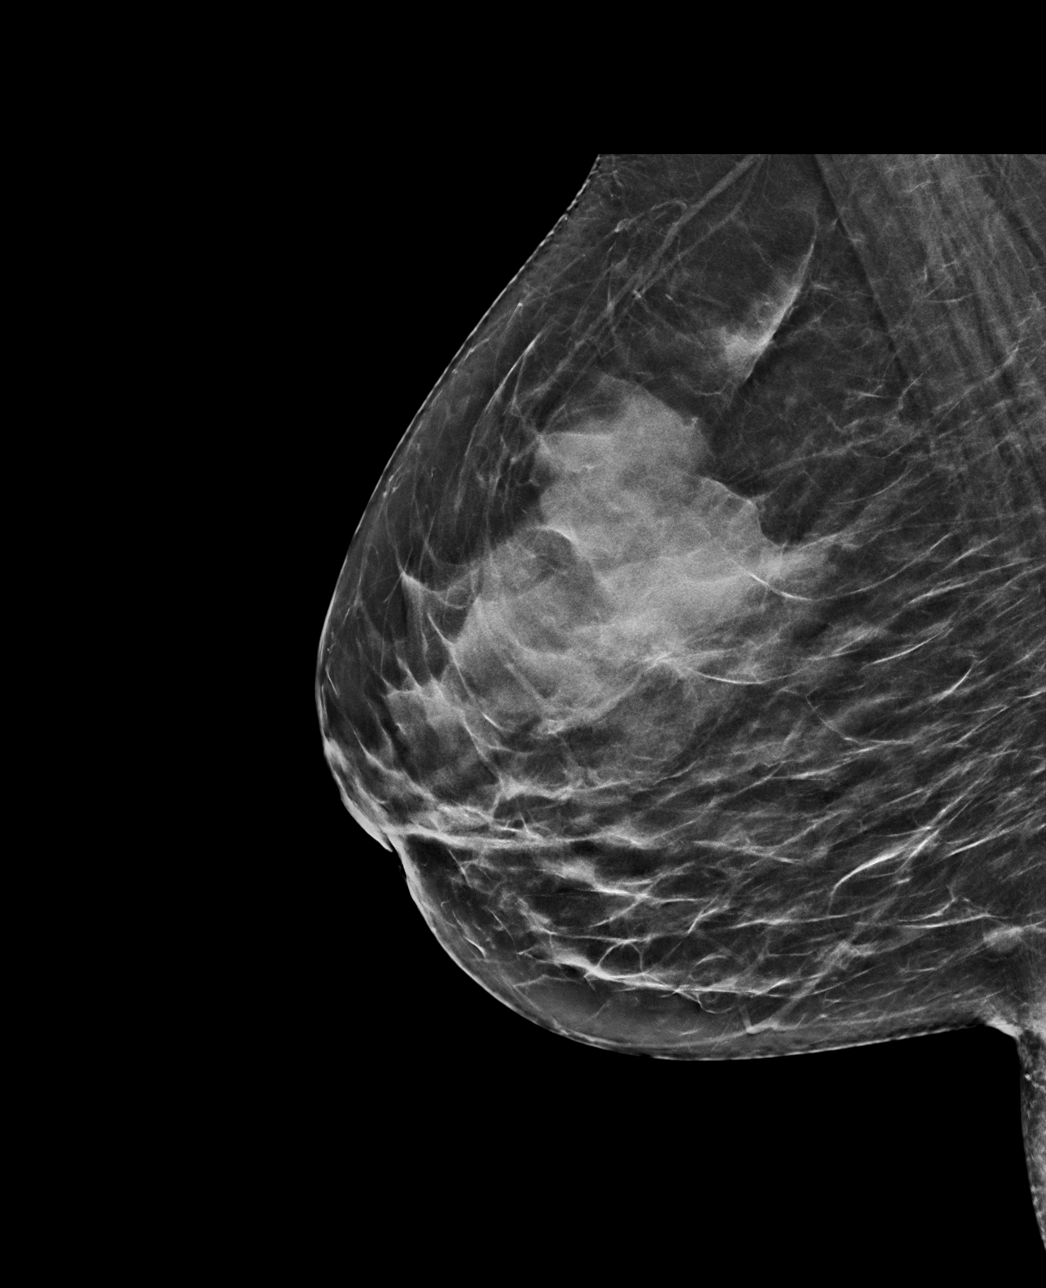

[R CC tomo · tomo slice 31/60.0]
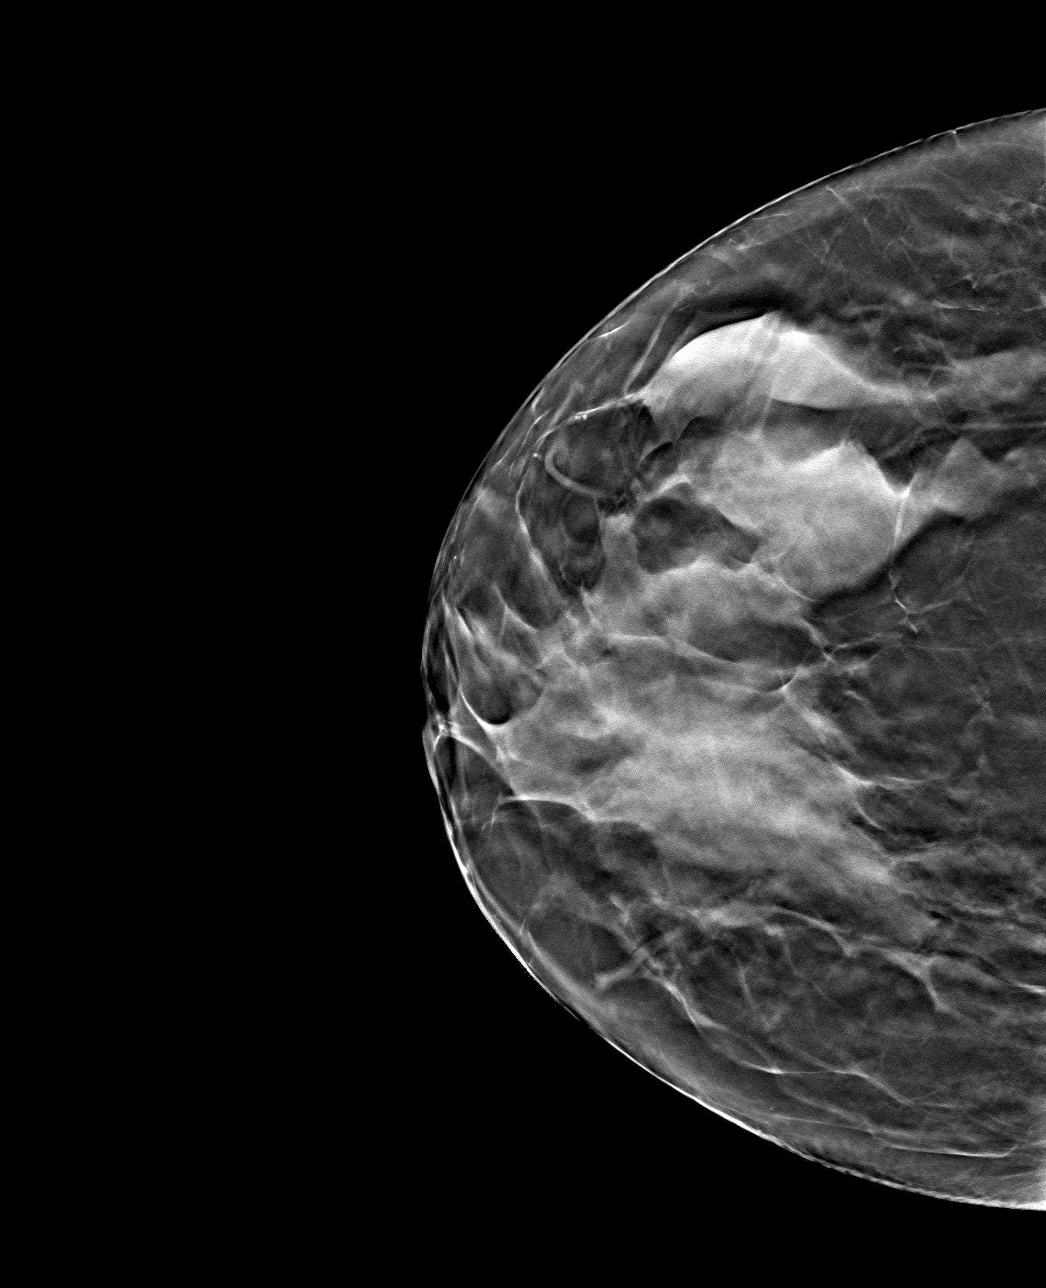

[R MLO tomo · tomo slice 33/65.0]
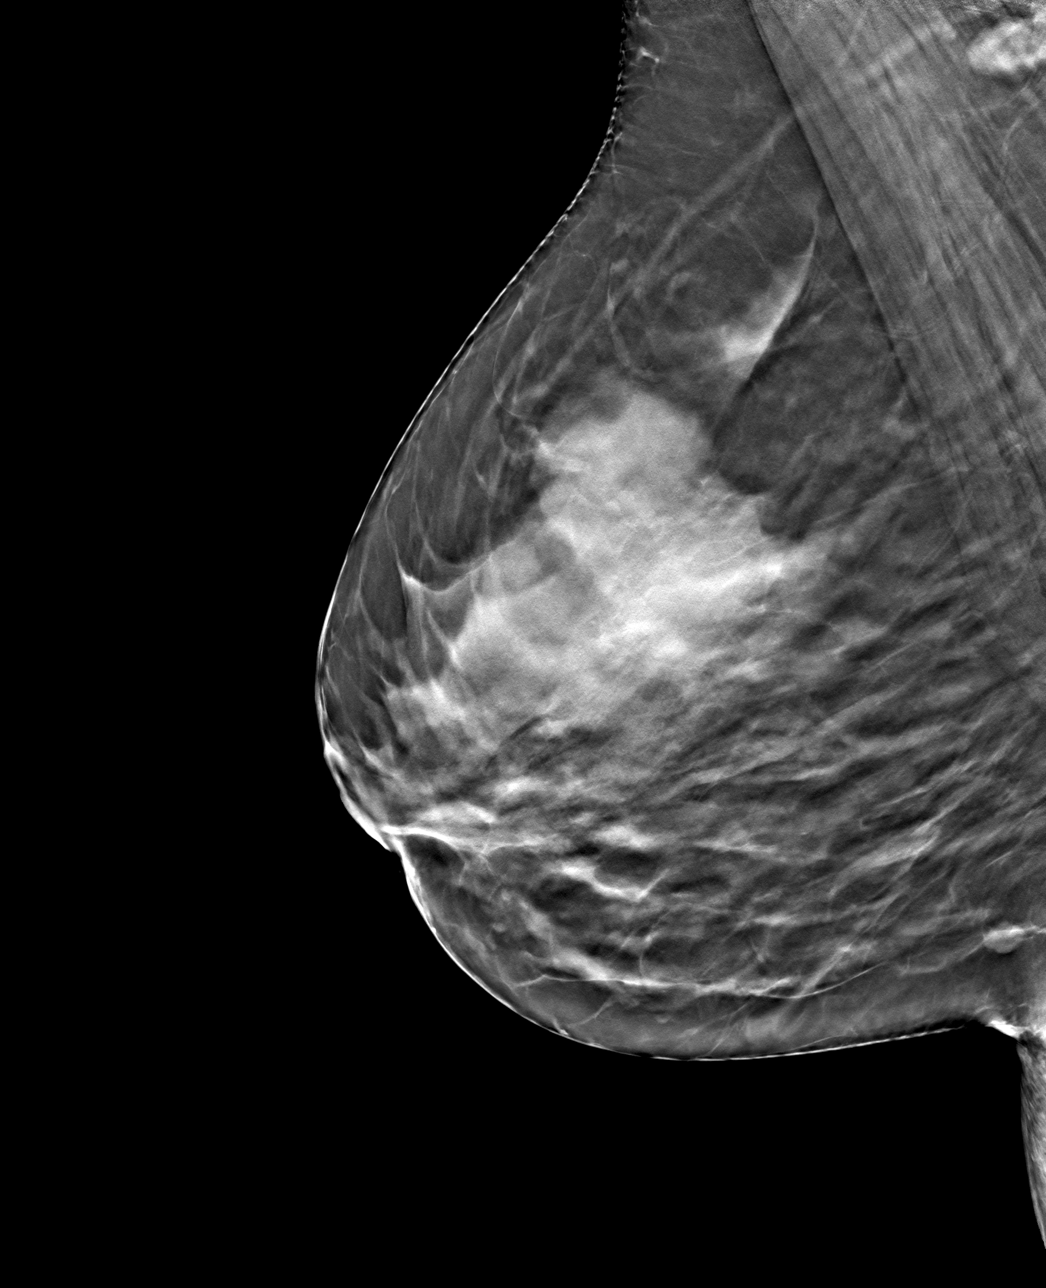

[4 of 12 positions shown; findings below may reference images not displayed]

ACR Breast Density Category c: The breast tissue is heterogeneously
dense, which may obscure small masses.
FINDINGS: No suspicious masses or calcifications seen in the right breast. The
oval circumscribed mass in the far posteroinferior right breast in
the region of the inframammary fold seen on the MLO view only
appears unchanged.

Targeted ultrasound of the lower central right breast was performed
demonstrating several small areas of fibrocystic change, with a cyst
at 5 o'clock 5 cm from nipple measuring 0.6 x 0.2 x 0.3 cm. This may
correspond with the mass seen in the right breast at mammography. No
suspicious masses or abnormality seen in the lower central right
breast.
IMPRESSION: Stable probably benign right breast mass.

RECOMMENDATION:
Recommend bilateral diagnostic mammography with possible right
breast ultrasound in 6 months which will demonstrate 1 year of
stability of the probably benign right breast mass.

I have discussed the findings and recommendations with the patient.
If applicable, a reminder letter will be sent to the patient
regarding the next appointment.

BI-RADS CATEGORY  3: Probably benign.

## 2022-03-20 ENCOUNTER — Other Ambulatory Visit: Payer: Self-pay | Admitting: Adult Health

## 2022-03-27 ENCOUNTER — Encounter (INDEPENDENT_AMBULATORY_CARE_PROVIDER_SITE_OTHER): Payer: Self-pay | Admitting: *Deleted

## 2022-06-07 ENCOUNTER — Ambulatory Visit (INDEPENDENT_AMBULATORY_CARE_PROVIDER_SITE_OTHER): Payer: BLUE CROSS/BLUE SHIELD | Admitting: Gastroenterology

## 2023-02-28 ENCOUNTER — Encounter: Payer: Self-pay | Admitting: Radiology

## 2024-08-21 ENCOUNTER — Encounter: Payer: Self-pay | Admitting: Radiology

## 2024-11-02 ENCOUNTER — Encounter: Payer: Self-pay | Admitting: Radiology
